# Patient Record
Sex: Female | Born: 1980 | Race: White | Hispanic: No | Smoking: Never smoker
Health system: Southern US, Community
[De-identification: ages and names within clinical notes are randomized; demographics above are authoritative.]

## PROBLEM LIST (undated history)

## (undated) ENCOUNTER — Inpatient Hospital Stay (HOSPITAL_COMMUNITY): Payer: Self-pay

## (undated) DIAGNOSIS — N390 Urinary tract infection, site not specified: Secondary | ICD-10-CM

## (undated) DIAGNOSIS — R51 Headache: Secondary | ICD-10-CM

## (undated) DIAGNOSIS — F429 Obsessive-compulsive disorder, unspecified: Secondary | ICD-10-CM

## (undated) DIAGNOSIS — F419 Anxiety disorder, unspecified: Secondary | ICD-10-CM

## (undated) DIAGNOSIS — B977 Papillomavirus as the cause of diseases classified elsewhere: Secondary | ICD-10-CM

## (undated) DIAGNOSIS — F329 Major depressive disorder, single episode, unspecified: Secondary | ICD-10-CM

## (undated) DIAGNOSIS — F32A Depression, unspecified: Secondary | ICD-10-CM

## (undated) DIAGNOSIS — M26629 Arthralgia of temporomandibular joint, unspecified side: Secondary | ICD-10-CM

## (undated) DIAGNOSIS — E7800 Pure hypercholesterolemia, unspecified: Secondary | ICD-10-CM

## (undated) HISTORY — PX: TONSILLECTOMY AND ADENOIDECTOMY: SUR1326

## (undated) HISTORY — DX: Pure hypercholesterolemia, unspecified: E78.00

## (undated) HISTORY — PX: ADENOIDECTOMY: SUR15

---
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
## 2002-01-04 ENCOUNTER — Emergency Department (HOSPITAL_COMMUNITY): Admission: EM | Admit: 2002-01-04 | Discharge: 2002-01-05 | Payer: Self-pay | Admitting: Emergency Medicine

## 2004-01-02 ENCOUNTER — Emergency Department (HOSPITAL_COMMUNITY): Admission: EM | Admit: 2004-01-02 | Discharge: 2004-01-02 | Payer: Self-pay | Admitting: Family Medicine

## 2004-01-11 ENCOUNTER — Emergency Department (HOSPITAL_COMMUNITY): Admission: EM | Admit: 2004-01-11 | Discharge: 2004-01-11 | Payer: Self-pay | Admitting: Family Medicine

## 2004-01-22 ENCOUNTER — Emergency Department (HOSPITAL_COMMUNITY): Admission: EM | Admit: 2004-01-22 | Discharge: 2004-01-22 | Payer: Self-pay | Admitting: Emergency Medicine

## 2004-02-03 ENCOUNTER — Emergency Department (HOSPITAL_COMMUNITY): Admission: EM | Admit: 2004-02-03 | Discharge: 2004-02-03 | Payer: Self-pay | Admitting: Emergency Medicine

## 2004-02-20 ENCOUNTER — Emergency Department (HOSPITAL_COMMUNITY): Admission: EM | Admit: 2004-02-20 | Discharge: 2004-02-20 | Payer: Self-pay | Admitting: *Deleted

## 2004-05-08 ENCOUNTER — Emergency Department (HOSPITAL_COMMUNITY): Admission: EM | Admit: 2004-05-08 | Discharge: 2004-05-08 | Payer: Self-pay | Admitting: Emergency Medicine

## 2004-05-10 ENCOUNTER — Emergency Department (HOSPITAL_COMMUNITY): Admission: EM | Admit: 2004-05-10 | Discharge: 2004-05-10 | Payer: Self-pay | Admitting: Family Medicine

## 2005-10-16 ENCOUNTER — Emergency Department (HOSPITAL_COMMUNITY): Admission: EM | Admit: 2005-10-16 | Discharge: 2005-10-16 | Payer: Self-pay | Admitting: Family Medicine

## 2006-03-13 ENCOUNTER — Emergency Department (HOSPITAL_COMMUNITY): Admission: EM | Admit: 2006-03-13 | Discharge: 2006-03-13 | Payer: Self-pay | Admitting: Family Medicine

## 2007-01-02 ENCOUNTER — Emergency Department (HOSPITAL_COMMUNITY): Admission: EM | Admit: 2007-01-02 | Discharge: 2007-01-02 | Payer: Self-pay | Admitting: Emergency Medicine

## 2007-04-21 ENCOUNTER — Emergency Department (HOSPITAL_COMMUNITY): Admission: EM | Admit: 2007-04-21 | Discharge: 2007-04-21 | Payer: Self-pay | Admitting: Emergency Medicine

## 2007-06-03 ENCOUNTER — Emergency Department (HOSPITAL_COMMUNITY): Admission: EM | Admit: 2007-06-03 | Discharge: 2007-06-03 | Payer: Self-pay | Admitting: Emergency Medicine

## 2007-11-12 ENCOUNTER — Emergency Department (HOSPITAL_COMMUNITY): Admission: EM | Admit: 2007-11-12 | Discharge: 2007-11-12 | Payer: Self-pay | Admitting: Emergency Medicine

## 2008-01-05 ENCOUNTER — Ambulatory Visit: Payer: Self-pay | Admitting: Family Medicine

## 2008-04-07 ENCOUNTER — Telehealth: Payer: Self-pay | Admitting: *Deleted

## 2008-04-08 ENCOUNTER — Telehealth (INDEPENDENT_AMBULATORY_CARE_PROVIDER_SITE_OTHER): Payer: Self-pay | Admitting: *Deleted

## 2008-04-08 ENCOUNTER — Ambulatory Visit: Payer: Self-pay | Admitting: Family Medicine

## 2008-05-22 ENCOUNTER — Inpatient Hospital Stay (HOSPITAL_COMMUNITY): Admission: AD | Admit: 2008-05-22 | Discharge: 2008-05-22 | Payer: Self-pay | Admitting: Obstetrics & Gynecology

## 2008-05-31 ENCOUNTER — Inpatient Hospital Stay (HOSPITAL_COMMUNITY): Admission: AD | Admit: 2008-05-31 | Discharge: 2008-05-31 | Payer: Self-pay | Admitting: Obstetrics and Gynecology

## 2008-06-17 ENCOUNTER — Telehealth: Payer: Self-pay | Admitting: *Deleted

## 2008-06-17 ENCOUNTER — Ambulatory Visit: Payer: Self-pay

## 2008-07-06 ENCOUNTER — Encounter: Admission: RE | Admit: 2008-07-06 | Discharge: 2008-07-06 | Payer: Self-pay | Admitting: Obstetrics & Gynecology

## 2008-07-26 ENCOUNTER — Ambulatory Visit: Payer: Self-pay | Admitting: Family Medicine

## 2008-08-05 ENCOUNTER — Inpatient Hospital Stay (HOSPITAL_COMMUNITY): Admission: AD | Admit: 2008-08-05 | Discharge: 2008-08-06 | Payer: Self-pay | Admitting: Obstetrics and Gynecology

## 2008-08-06 ENCOUNTER — Telehealth: Payer: Self-pay | Admitting: *Deleted

## 2008-09-02 ENCOUNTER — Inpatient Hospital Stay (HOSPITAL_COMMUNITY): Admission: AD | Admit: 2008-09-02 | Discharge: 2008-09-02 | Payer: Self-pay | Admitting: Obstetrics and Gynecology

## 2008-09-04 ENCOUNTER — Inpatient Hospital Stay (HOSPITAL_COMMUNITY): Admission: AD | Admit: 2008-09-04 | Discharge: 2008-09-04 | Payer: Self-pay | Admitting: Obstetrics and Gynecology

## 2008-09-06 ENCOUNTER — Inpatient Hospital Stay (HOSPITAL_COMMUNITY): Admission: AD | Admit: 2008-09-06 | Discharge: 2008-09-06 | Payer: Self-pay | Admitting: Obstetrics and Gynecology

## 2008-09-06 ENCOUNTER — Inpatient Hospital Stay (HOSPITAL_COMMUNITY): Admission: AD | Admit: 2008-09-06 | Discharge: 2008-09-09 | Payer: Self-pay | Admitting: Obstetrics and Gynecology

## 2008-10-11 ENCOUNTER — Telehealth (INDEPENDENT_AMBULATORY_CARE_PROVIDER_SITE_OTHER): Payer: Self-pay | Admitting: Family Medicine

## 2008-10-12 ENCOUNTER — Ambulatory Visit: Payer: Self-pay | Admitting: Family Medicine

## 2008-10-12 DIAGNOSIS — F329 Major depressive disorder, single episode, unspecified: Secondary | ICD-10-CM

## 2008-10-18 ENCOUNTER — Emergency Department (HOSPITAL_COMMUNITY): Admission: EM | Admit: 2008-10-18 | Discharge: 2008-10-18 | Payer: Self-pay | Admitting: Emergency Medicine

## 2008-10-18 ENCOUNTER — Telehealth (INDEPENDENT_AMBULATORY_CARE_PROVIDER_SITE_OTHER): Payer: Self-pay | Admitting: Family Medicine

## 2008-11-12 ENCOUNTER — Encounter: Admission: RE | Admit: 2008-11-12 | Discharge: 2008-11-12 | Payer: Self-pay | Admitting: Obstetrics and Gynecology

## 2008-11-24 ENCOUNTER — Encounter (INDEPENDENT_AMBULATORY_CARE_PROVIDER_SITE_OTHER): Payer: Self-pay | Admitting: Family Medicine

## 2009-02-16 ENCOUNTER — Emergency Department (HOSPITAL_COMMUNITY): Admission: EM | Admit: 2009-02-16 | Discharge: 2009-02-16 | Payer: Self-pay | Admitting: Family Medicine

## 2009-02-16 ENCOUNTER — Emergency Department (HOSPITAL_COMMUNITY): Admission: EM | Admit: 2009-02-16 | Discharge: 2009-02-16 | Payer: Self-pay | Admitting: Emergency Medicine

## 2009-05-23 ENCOUNTER — Emergency Department (HOSPITAL_COMMUNITY): Admission: EM | Admit: 2009-05-23 | Discharge: 2009-05-23 | Payer: Self-pay | Admitting: Emergency Medicine

## 2010-02-15 ENCOUNTER — Emergency Department (HOSPITAL_COMMUNITY): Admission: EM | Admit: 2010-02-15 | Discharge: 2010-02-15 | Payer: Self-pay | Admitting: Emergency Medicine

## 2010-05-20 ENCOUNTER — Emergency Department (HOSPITAL_COMMUNITY)
Admission: EM | Admit: 2010-05-20 | Discharge: 2010-05-20 | Payer: Self-pay | Source: Home / Self Care | Admitting: Emergency Medicine

## 2010-05-25 ENCOUNTER — Emergency Department (HOSPITAL_COMMUNITY): Admission: EM | Admit: 2010-05-25 | Discharge: 2009-07-05 | Payer: Self-pay | Admitting: Emergency Medicine

## 2010-07-30 ENCOUNTER — Emergency Department (HOSPITAL_COMMUNITY): Payer: Self-pay

## 2010-07-30 ENCOUNTER — Emergency Department (HOSPITAL_COMMUNITY)
Admission: EM | Admit: 2010-07-30 | Discharge: 2010-07-30 | Disposition: A | Discharge status: Home / Self Care | Payer: Self-pay | Attending: Emergency Medicine | Admitting: Emergency Medicine

## 2010-07-30 DIAGNOSIS — R51 Headache: Secondary | ICD-10-CM | POA: Insufficient documentation

## 2010-07-30 DIAGNOSIS — F341 Dysthymic disorder: Secondary | ICD-10-CM | POA: Insufficient documentation

## 2010-07-30 DIAGNOSIS — IMO0002 Reserved for concepts with insufficient information to code with codable children: Secondary | ICD-10-CM | POA: Insufficient documentation

## 2010-07-30 DIAGNOSIS — Y929 Unspecified place or not applicable: Secondary | ICD-10-CM | POA: Insufficient documentation

## 2010-07-30 DIAGNOSIS — S0990XA Unspecified injury of head, initial encounter: Secondary | ICD-10-CM | POA: Insufficient documentation

## 2010-07-30 DIAGNOSIS — F8081 Childhood onset fluency disorder: Secondary | ICD-10-CM | POA: Insufficient documentation

## 2010-08-28 LAB — BASIC METABOLIC PANEL
BUN: 13 mg/dL (ref 6–23)
Chloride: 103 mEq/L (ref 96–112)
Creatinine, Ser: 0.67 mg/dL (ref 0.4–1.2)
GFR calc non Af Amer: >60 mL/min (ref >60)
Glucose, Bld: 107 mg/dL — ABNORMAL HIGH (ref 70–99)
Potassium: 4.1 mEq/L (ref 3.5–5.1)

## 2010-08-28 LAB — CBC
HCT: 39 % (ref 36.0–46.0)
MCHC: 34.1 g/dL (ref 30.0–36.0)
MCV: 87.6 fL (ref 78.0–100.0)
Platelets: 313 10*3/uL (ref 150–400)
RDW: 13.2 % (ref 11.5–15.5)
WBC: 9 10*3/uL (ref 4.0–10.5)

## 2010-08-28 LAB — DIFFERENTIAL
Basophils Absolute: 0 10*3/uL (ref 0.0–0.1)
Basophils Relative: 0 % (ref 0–1)
Eosinophils Relative: 1 % (ref 0–5)
Lymphocytes Relative: 41 % (ref 12–46)
Monocytes Absolute: 0.5 10*3/uL (ref 0.1–1.0)

## 2010-08-31 LAB — POCT I-STAT, CHEM 8
Calcium, Ion: 1.27 mmol/L (ref 1.12–1.32)
HCT: 43 % (ref 36.0–46.0)
Hemoglobin: 14.6 g/dL (ref 12.0–15.0)
Sodium: 140 mEq/L (ref 135–145)
TCO2: 29 mmol/L (ref 0–100)

## 2010-09-19 LAB — POCT PREGNANCY, URINE: Preg Test, Ur: NEGATIVE

## 2010-09-22 LAB — URINALYSIS, ROUTINE W REFLEX MICROSCOPIC
Bilirubin Urine: NEGATIVE
Ketones, ur: NEGATIVE mg/dL
Leukocytes, UA: NEGATIVE
Nitrite: NEGATIVE
Protein, ur: NEGATIVE mg/dL
Urobilinogen, UA: 0.2 mg/dL (ref 0.0–1.0)

## 2010-09-22 LAB — GC/CHLAMYDIA PROBE AMP, GENITAL: Chlamydia, DNA Probe: NEGATIVE

## 2010-09-22 LAB — PROTIME-INR
INR: 0.9 (ref 0.00–1.49)
Prothrombin Time: 12.1 seconds (ref 11.6–15.2)

## 2010-09-22 LAB — WET PREP, GENITAL
Trich, Wet Prep: NONE SEEN
Yeast Wet Prep HPF POC: NONE SEEN

## 2010-09-22 LAB — DIFFERENTIAL
Basophils Relative: 0 % (ref 0–1)
Eosinophils Absolute: 0.1 10*3/uL (ref 0.0–0.7)
Lymphs Abs: 2 10*3/uL (ref 0.7–4.0)
Neutro Abs: 5.8 10*3/uL (ref 1.7–7.7)
Neutrophils Relative %: 70 % (ref 43–77)

## 2010-09-22 LAB — CBC
MCV: 87.8 fL (ref 78.0–100.0)
Platelets: 264 10*3/uL (ref 150–400)
RBC: 4.29 MIL/uL (ref 3.87–5.11)
WBC: 8.4 10*3/uL (ref 4.0–10.5)

## 2010-09-22 LAB — URINE MICROSCOPIC-ADD ON

## 2010-09-22 LAB — APTT: aPTT: 32 seconds (ref 24–37)

## 2010-09-28 LAB — CBC
HCT: 39.2 % (ref 36.0–46.0)
Hemoglobin: 12.2 g/dL (ref 12.0–15.0)
Platelets: 142 10*3/uL — ABNORMAL LOW (ref 150–400)
Platelets: 217 10*3/uL (ref 150–400)
RBC: 4.02 MIL/uL (ref 3.87–5.11)
RDW: 14.1 % (ref 11.5–15.5)
WBC: 12.6 10*3/uL — ABNORMAL HIGH (ref 4.0–10.5)

## 2010-09-28 LAB — URINE MICROSCOPIC-ADD ON

## 2010-09-28 LAB — GLUCOSE, CAPILLARY: Glucose-Capillary: 130 mg/dL — ABNORMAL HIGH (ref 70–99)

## 2010-09-28 LAB — COMPREHENSIVE METABOLIC PANEL
ALT: 18 U/L (ref 0–35)
Alkaline Phosphatase: 106 U/L (ref 39–117)
CO2: 24 mEq/L (ref 19–32)
Chloride: 103 mEq/L (ref 96–112)
GFR calc non Af Amer: >60 mL/min (ref >60)
Glucose, Bld: 104 mg/dL — ABNORMAL HIGH (ref 70–99)
Potassium: 3.8 mEq/L (ref 3.5–5.1)
Sodium: 135 mEq/L (ref 135–145)
Total Bilirubin: 0.3 mg/dL (ref 0.3–1.2)

## 2010-09-28 LAB — URINALYSIS, ROUTINE W REFLEX MICROSCOPIC
Bilirubin Urine: NEGATIVE
Glucose, UA: NEGATIVE mg/dL
Ketones, ur: NEGATIVE mg/dL
Protein, ur: NEGATIVE mg/dL
pH: 6 (ref 5.0–8.0)

## 2010-09-28 LAB — URIC ACID: Uric Acid, Serum: 5.4 mg/dL (ref 2.4–7.0)

## 2010-10-31 NOTE — Discharge Summary (Signed)
Debra Herrera, Debra Herrera            ACCOUNT NO.:  [id number]   MEDICAL RECORD NO.:  [id number]          PATIENT TYPE:  INP   LOCATION:  9131                          FACILITY:  [facility]   PHYSICIAN:  Gerrit Friends. Aldona Bar, M.D.   DATE OF BIRTH:  1981-02-07   DATE OF ADMISSION:  09/06/2008  DATE OF DISCHARGE:                               DISCHARGE SUMMARY   DISCHARGE DIAGNOSES:  1. Term pregnancy delivered 7 pounds 11 ounce female infant, Apgars 8      and 9.  2. Blood type O negative - baby Rh negative, RhoGAM not needed.  3. Induction of labor at term for gestational diabetes, medication      controlled.   PROCEDURES:  1. Vacuum extraction-assisted delivery.  2. Second-degree midline episiotomy.   SUMMARY:  This 30 year old primigravida with a due date of September 09, 2008, was admitted on September 06, 2008, for induction because of  gestational diabetes controlled by glyburide.  She was induced,  progressed, and ultimately required a vacuum extraction-assisted  delivery and was delivered of a 7 pounds 11 ounces female infant with  Apgars of 8 and 9 over a second-degree midline episiotomy.  Her  postpartum course was uncomplicated.  Discharge hemoglobin was 9.1 with  a white count of 12,600, and platelet count of 142,000.  On the morning  of September 09, 2008, she was ambulating well, tolerating a regular diet  well, was breast-feeding with minimal difficulty, was having normal  bowel and bladder function, vital signs were stable, and she was ready  for discharge.  Accordingly, she was given all appropriate instructions  and understood all instructions well.   Discharge medications include vitamins as long she is breast-feeding,  Feosol capsules - 1 daily, Motrin 600 mg every 6 hours as needed for  cramping or mild pain, and Tylox 1-2 every 4-6 hours as needed for more  severe pain.  She will return to the office for followup in  approximately 4 weeks' time or as needed.  She was given a  discharge  brochure at the time of discharge and understood all instructions fairly  well.   CONDITION ON DISCHARGE:  Improved.      Gerrit Friends. Aldona Bar, M.D.  Electronically Signed     RMW/MEDQ  D:  09/09/2008  T:  09/09/2008  Job:  [id number]

## 2011-01-10 ENCOUNTER — Inpatient Hospital Stay (INDEPENDENT_AMBULATORY_CARE_PROVIDER_SITE_OTHER)
Admission: RE | Admit: 2011-01-10 | Discharge: 2011-01-10 | Disposition: A | Discharge status: Home / Self Care | Payer: Self-pay | Source: Ambulatory Visit | Attending: Emergency Medicine | Admitting: Emergency Medicine

## 2011-01-10 DIAGNOSIS — M26609 Unspecified temporomandibular joint disorder, unspecified side: Secondary | ICD-10-CM

## 2011-01-19 ENCOUNTER — Encounter (HOSPITAL_COMMUNITY): Payer: Self-pay | Admitting: *Deleted

## 2011-01-19 ENCOUNTER — Inpatient Hospital Stay (HOSPITAL_COMMUNITY)
Admission: AD | Admit: 2011-01-19 | Discharge: 2011-01-19 | Disposition: A | Discharge status: Home / Self Care | Payer: Self-pay | Source: Ambulatory Visit | Attending: Obstetrics and Gynecology | Admitting: Obstetrics and Gynecology

## 2011-01-19 DIAGNOSIS — N912 Amenorrhea, unspecified: Secondary | ICD-10-CM | POA: Insufficient documentation

## 2011-01-19 DIAGNOSIS — N949 Unspecified condition associated with female genital organs and menstrual cycle: Secondary | ICD-10-CM | POA: Insufficient documentation

## 2011-01-19 HISTORY — DX: Urinary tract infection, site not specified: N39.0

## 2011-01-19 HISTORY — DX: Headache: R51

## 2011-01-19 HISTORY — DX: Papillomavirus as the cause of diseases classified elsewhere: B97.7

## 2011-01-19 LAB — URINALYSIS, ROUTINE W REFLEX MICROSCOPIC
Ketones, ur: NEGATIVE mg/dL
Nitrite: NEGATIVE
Specific Gravity, Urine: >1.03 — ABNORMAL HIGH (ref 1.005–1.030)
pH: 6 (ref 5.0–8.0)

## 2011-01-19 LAB — WET PREP, GENITAL: Trich, Wet Prep: NONE SEEN

## 2011-01-19 LAB — URINE MICROSCOPIC-ADD ON

## 2011-01-19 NOTE — ED Notes (Signed)
To lobby to wait on results. 

## 2011-01-19 NOTE — ED Provider Notes (Signed)
History     Chief Complaint  Patient presents with  . Vaginal Discharge   HPI pt is not pregnant and presents with LMP 6/15 and thinks she may be pregnant.  She is not using anything for birth control.  She also has a thick yellow discharge with an odor.  Pt has a history of gestational diabetes and was put on gylburide. Pt delivered in 2010.  She has been a pt of Dr. Ewell Poe but does not have insurance now and cannot afford to return to their office.  She thinks she has put on weight recently.  She has not had follow up of her blood sugar.  She denies constipation or diarrhea or fever or pain.  She denies pain with urination.  She has a hx of UTI and BV.    Past Medical History  Diagnosis Date  . Headache   . Gestational diabetes   . Urinary tract infection   . Human papilloma virus     Past Surgical History  Procedure Date  . Tonsillectomy and adenoidectomy     No family history on file.  History  Substance Use Topics  . Smoking status: Never Smoker   . Smokeless tobacco: Not on file  . Alcohol Use: No    Allergies:  Allergies  Allergen Reactions  . Benadryl (Diphenhydramine Hcl) Rash  . Sulfa Antibiotics Rash    Prescriptions prior to admission  Medication Sig Dispense Refill  . cyclobenzaprine (FLEXERIL) 5 MG tablet Take 5-10 mg by mouth at bedtime as needed. As needed for muscle spasm       . diclofenac (VOLTAREN) 75 MG EC tablet Take 75 mg by mouth 2 (two) times daily as needed. As needed for pain       . escitalopram (LEXAPRO) 20 MG tablet Take 20 mg by mouth daily.        Marland Kitchen neomycin-bacitracin-polymyxin (NEOSPORIN) ointment Apply 1 application topically every 12 (twelve) hours. Apply as needed to blister and tick bite         ROS Physical Exam   Blood pressure 123/85, pulse 88, temperature 98.2 F (36.8 C), temperature source Oral, resp. rate 16, height 5' 3.8" (1.621 m), weight 184 lb 12.8 oz (83.825 kg), last menstrual period 12/03/2010, SpO2  98.00%.  Physical Exam  Nursing note and vitals reviewed. Constitutional: She is oriented to person, place, and time. She appears well-developed and well-nourished. No distress.  HENT:  Head: Normocephalic.  Eyes: Pupils are equal, round, and reactive to light.  Neck: Normal range of motion. Neck supple.  Cardiovascular: Normal rate.   Respiratory: Effort normal.  GI: Soft. There is no tenderness.  Genitourinary:       Mod amount of thick yellow discharge in vault; cervix clean nontender; uterus NSSC nontender  Musculoskeletal: Normal range of motion.  Neurological: She is alert and oriented to person, place, and time.  Skin: Skin is warm and dry.  Psychiatric: She has a normal mood and affect.    MAU Course  Procedures UPT negative; pelvic exam performed with wet prep and GC/chlamydia Discussed possible reasons for amenorrhea   Assessment and Plan  Amenorrhea Normal wet prep Debra Herrera 01/19/2011, 5:19 PM

## 2011-01-19 NOTE — Progress Notes (Signed)
Pt states she had not had a period since 6-17 and wants a pregnancy test. Has had a yellow/green vaginal d/c with a slight odor, no pain.

## 2011-01-20 LAB — GC/CHLAMYDIA PROBE AMP, GENITAL
Chlamydia, DNA Probe: NEGATIVE
GC Probe Amp, Genital: NEGATIVE

## 2011-01-24 NOTE — ED Provider Notes (Signed)
Agree with above note.  Debra Herrera 01/24/2011 4:39 PM

## 2011-01-30 ENCOUNTER — Ambulatory Visit (INDEPENDENT_AMBULATORY_CARE_PROVIDER_SITE_OTHER): Payer: Self-pay | Admitting: Family Medicine

## 2011-01-30 ENCOUNTER — Encounter: Payer: Self-pay | Admitting: Family Medicine

## 2011-01-30 VITALS — BP 112/76 | HR 86 | Ht 64.0 in | Wt 184.0 lb

## 2011-01-30 DIAGNOSIS — N912 Amenorrhea, unspecified: Secondary | ICD-10-CM

## 2011-01-30 DIAGNOSIS — N911 Secondary amenorrhea: Secondary | ICD-10-CM

## 2011-01-30 NOTE — Progress Notes (Deleted)
  Subjective:    Patient ID: Debra Herrera, female    DOB: Apr 16, 1981, 30 y.o.   MRN: [id number]  HPI Debra Herrera is 30 y.o. G [facility] presenting with concern for missing her menstrual period in July.    Irregular Menstruation Patient complains of irregular menses. Patient's last menstrual period was 12/03/2010. Menarche age: ***. Periods are {reg/irreg:715}, lasting {1-10:13787} days. Dysmenorrhea:{dysmenorrhea:716}. Cyclic symptoms include: {symptoms:13153}. Current contraception: {contraceptive method:5051}. History of infertility: {yes***/no:17258}. History of abnormal Pap smear: {yes***/no:17258}.  HA, visual disturbances, weight gain/loss Review of Systems      Objective:   Physical Exam        Assessment & Plan:

## 2011-01-30 NOTE — Patient Instructions (Signed)
I would like for you to be able to return to our clinic in 6 weeks for an assessment. Please read the diet information below and try to incorporate to 30 minutes of exercise into your diet 5 days a week. Also, please start taking prenatal vitamins in case you do become pregnant. Also, please complete your forms for financial assistance. Thank you for coming to clinic today.  - Dr. Clinton Sawyer    Calorie Counting Diet A calorie counting diet requires you to eat the number of calories that are right for you during a day. Calories are the measurement of how much energy you get from the food you eat. Eating the right amount of calories is important for staying at a healthy weight. If you eat too many calories your body will store them as fat and you may gain weight. If you eat too few calories you may lose weight. Counting the number of calories that you eat during a day will help you to know if you're eating the right amount. A Registered Dietitian can determine how many calories you need in a day. The amount of calories you need varies from person to person. If your goal is to lose weight you will need to eat fewer calories. Losing weight can benefit you if you are overweight or have health problems such as heart disease, high blood pressure or diabetes. If your goal is to gain weight, you will need to eat more calories. Gaining weight may be necessary if you have a certain health problem that causes your body to need more energy. TIPS Whether you are increasing or decreasing the number of calories you eat during a day, it may be hard to get used to changing what you eat and drink. The following are tips to help you keep track of the number of calories you are eating.  Measuring foods at home with measuring cups will help you to know the actual amount of food and number of calories you are eating.   Restaurants serve food in all different portion sizes. It is common that restaurants will serve food in  amounts worth 2 or more serving sizes. While eating out, it may be helpful to estimate how many servings of a food you are given. For example, a serving of cooked rice is 1/2 cup and that is the size of half of a fist. Knowing serving sizes will help you have a better idea of how much food you are eating at restaurants.   Ask for smaller portion sizes or child-size portions at restaurants.   Plan to eat half of a meal at a restaurant and take the rest home or share the other half with a friend   Read food labels for calorie content and serving size   Most packaged food has a Nutrition Facts Panel on its side or back. Here you can find out how many servings are in a package, the size of a serving, and the number of calories each serving has.   The serving size and number of servings per container are listed right below the Nutrition Facts heading. Just below the serving information, the number of calories in each serving is listed.   For example, say that a package has three cookies inside. The Nutrition Facts panel says that one serving is one cookie. Below that, it says that there are three servings in the container. The calories section of the Nutrition Facts says there are 90 calories. That means that there are  90 calories in one cookie. If you eat one cookie you have eaten 90 calories. If you eat all three cookies, you have eaten three times that amount, or 270 calories.  The list below tells you how big or small some common portion sizes are.  1 ounce (oz).................4 stacked dice.   3 oz.............................Marland KitchenDeck of cards.   1 teaspoon (tsp)..........Marland KitchenTip of little finger.   1 tablespoon (Tbsp).Marland KitchenMarland KitchenMarland KitchenTip of thumb.   2 Tbsp.........................Marland KitchenGolf ball.    Cup.........................Marland KitchenHalf of a fist.   1 Cup..........................Marland KitchenA fist.  KEEP A FOOD LOG Write down every food item that you eat, how much of the food you eat, and the number of calories in each  food that you eat during the day. At the end of the day or throughout the day you can add up the total number of calories you have eaten.   It may help to set up a list like the one below. Find out the calorie information by reading food labels.  Breakfast   Bran Flakes (1 cup, 110 calories).   Fat free milk ( cup, 45 calories).   Snack   Apple (1 medium, 80 calories).   Lunch   Spinach (1 cup, 20 calories).   Tomato ( medium, 20 calories).   Chicken breast strips (3 oz, 165 calories).   Shredded cheddar cheese ( cup, 110 calories).   Light [place] dressing (2 Tbsp, 60 calories).   Whole wheat bread (1 slice, 80 calories).   Tub margarine (1 tsp, 35 calories).   Vegetable soup (1 cup, 160 calories).   Dinner   Pork chop (3 oz, 190 calories).   Brown rice (1 cup, 215 calories).   Steamed broccoli ( cup, 20 calories).   Strawberries (1  cup, 65 calories).   Whipped cream (1 Tbsp, 50 calories).  Daily Calorie Total: 1425 Information from www.eatright.org, Foodwise Nutritional Analysis Database. Document Released: 06/04/2005 Document Re-Released: 06/26/2009 [facility] Patient Information 2011 [place], [place].

## 2011-01-30 NOTE — Progress Notes (Signed)
Subjective:    Debra Herrera is a 30 y.o. female who presents for evaluation of infrequent menses. Last menstrual period was 9 weeks ago. Current menses pattern: regular every 32-34 days.  Age of menarche was 30 years old. Current contraceptive method: none. Associated/contributing factors include family history of diabetes, hirsutism and obesity. Patient denies acne, galactorrhea, hyperglycemia, target symptoms of hypothyroidism, visual changes and new social stressors.. Evaluation to date: negative urine pregnancy test on 01/19/11 at the [facility] ED and also had CT of brain in Feb 2012 for unrelated trauma and there were no masses or anatomic abnormalities noted.   Patient's last menstrual period was 12/03/2010. The following portions of the patient's history were reviewed and updated as appropriate: current medications, past family history, past medical history and problem list. Review of Systems A comprehensive review of systems was negative except for: Gastrointestinal: positive for abdominal pain    Objective:  BP 112/76  Pulse 86  Ht 5\' 4"  (1.626 m)  Wt 184 lb (83.462 kg)  BMI 31.58 kg/m2  LMP 12/03/2010   General appearance: alert, cooperative, appears older than stated age and moderately obese Head: Normocephalic, without obvious abnormality, atraumatic Eyes: negative Neck: no adenopathy, no carotid bruit, no JVD, supple, symmetrical, trachea midline and thyroid not enlarged, symmetric, no tenderness/mass/nodules Lungs: clear to auscultation bilaterally Heart: regular rate and rhythm, S1, S2 normal, no murmur, click, rub or gallop Abdomen: soft, non-tender; bowel sounds normal; no masses,  no organomegaly Pulses: 2+ and symmetric Skin: Skin color, texture, turgor normal. No rashes or lesions  Endocrine:course brown hair on patient upper lip     Assessment:    Suspect polycystic ovary syndrome.    Plan:    Discussed the evaluation and treatment of amenorrhea with  the patient. Agricultural engineer distributed. Exercise goals: 30 minutes per day 5 days a week of walking RTC in 6 weeks or prn Also advised patient to restart prenatal vitamins given that she is actively trying to get pregnant

## 2011-02-07 ENCOUNTER — Encounter: Payer: Self-pay | Admitting: Family Medicine

## 2011-02-07 DIAGNOSIS — N911 Secondary amenorrhea: Secondary | ICD-10-CM | POA: Insufficient documentation

## 2011-02-07 NOTE — Assessment & Plan Note (Signed)
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 Given that this problem is relatively new in onset, she was advised to try to the least invasive and least costly approach to solving the issue which is weight loss. She will return in 6 weeks for further follow-up. At that time we will consider further work-up for the cause of secondary amenorrhea.

## 2011-02-24 ENCOUNTER — Emergency Department (HOSPITAL_COMMUNITY)
Admission: EM | Admit: 2011-02-24 | Discharge: 2011-02-25 | Disposition: A | Discharge status: Home / Self Care | Payer: Self-pay | Attending: Emergency Medicine | Admitting: Emergency Medicine

## 2011-02-24 DIAGNOSIS — M79609 Pain in unspecified limb: Secondary | ICD-10-CM | POA: Insufficient documentation

## 2011-02-24 DIAGNOSIS — W2203XA Walked into furniture, initial encounter: Secondary | ICD-10-CM | POA: Insufficient documentation

## 2011-02-24 DIAGNOSIS — S90129A Contusion of unspecified lesser toe(s) without damage to nail, initial encounter: Secondary | ICD-10-CM | POA: Insufficient documentation

## 2011-02-24 DIAGNOSIS — Y92009 Unspecified place in unspecified non-institutional (private) residence as the place of occurrence of the external cause: Secondary | ICD-10-CM | POA: Insufficient documentation

## 2011-02-25 ENCOUNTER — Emergency Department (HOSPITAL_COMMUNITY): Payer: Self-pay

## 2011-03-16 ENCOUNTER — Ambulatory Visit: Payer: Self-pay | Admitting: Family Medicine

## 2011-03-23 LAB — URINALYSIS, ROUTINE W REFLEX MICROSCOPIC
Hgb urine dipstick: NEGATIVE
Ketones, ur: 15 mg/dL — AB
Protein, ur: NEGATIVE mg/dL
Urobilinogen, UA: 1 mg/dL (ref 0.0–1.0)

## 2011-03-23 LAB — URINE MICROSCOPIC-ADD ON

## 2011-04-04 ENCOUNTER — Inpatient Hospital Stay (INDEPENDENT_AMBULATORY_CARE_PROVIDER_SITE_OTHER)
Admission: RE | Admit: 2011-04-04 | Discharge: 2011-04-04 | Disposition: A | Discharge status: Home / Self Care | Payer: Self-pay | Source: Ambulatory Visit | Attending: Emergency Medicine | Admitting: Emergency Medicine

## 2011-04-04 DIAGNOSIS — G44209 Tension-type headache, unspecified, not intractable: Secondary | ICD-10-CM

## 2011-06-27 ENCOUNTER — Emergency Department (HOSPITAL_COMMUNITY): Admission: EM | Admit: 2011-06-27 | Discharge: 2011-07-20 | Disposition: E | Payer: Self-pay | Source: Home / Self Care

## 2011-06-27 ENCOUNTER — Encounter (HOSPITAL_COMMUNITY): Payer: Self-pay | Admitting: Cardiology

## 2011-06-27 ENCOUNTER — Emergency Department (INDEPENDENT_AMBULATORY_CARE_PROVIDER_SITE_OTHER)
Admission: EM | Admit: 2011-06-27 | Discharge: 2011-06-27 | Disposition: A | Discharge status: Home / Self Care | Payer: Self-pay | Source: Home / Self Care | Attending: Emergency Medicine | Admitting: Emergency Medicine

## 2011-06-27 DIAGNOSIS — B9689 Other specified bacterial agents as the cause of diseases classified elsewhere: Secondary | ICD-10-CM

## 2011-06-27 DIAGNOSIS — N912 Amenorrhea, unspecified: Secondary | ICD-10-CM

## 2011-06-27 DIAGNOSIS — A499 Bacterial infection, unspecified: Secondary | ICD-10-CM

## 2011-06-27 DIAGNOSIS — J020 Streptococcal pharyngitis: Secondary | ICD-10-CM

## 2011-06-27 DIAGNOSIS — N760 Acute vaginitis: Secondary | ICD-10-CM

## 2011-06-27 LAB — WET PREP, GENITAL
Clue Cells Wet Prep HPF POC: NONE SEEN
Trich, Wet Prep: NONE SEEN
Yeast Wet Prep HPF POC: NONE SEEN

## 2011-06-27 LAB — POCT URINALYSIS DIP (DEVICE)
Bilirubin Urine: NEGATIVE
Glucose, UA: NEGATIVE mg/dL
Ketones, ur: NEGATIVE mg/dL
Specific Gravity, Urine: 1.015 (ref 1.005–1.030)
Urobilinogen, UA: 0.2 mg/dL (ref 0.0–1.0)

## 2011-06-27 LAB — POCT PREGNANCY, URINE: Preg Test, Ur: NEGATIVE

## 2011-06-27 MED ORDER — METRONIDAZOLE 500 MG PO TABS: 500.0000 mg | ORAL_TABLET | Freq: Two times a day (BID) | ORAL | Status: AC

## 2011-06-27 MED ORDER — PENICILLIN V POTASSIUM 500 MG PO TABS: 500.0000 mg | ORAL_TABLET | Freq: Four times a day (QID) | ORAL | Status: AC

## 2011-06-27 MED ORDER — FLUCONAZOLE 150 MG PO TABS: 150.0000 mg | ORAL_TABLET | Freq: Once | ORAL | Status: AC

## 2011-06-27 MED ORDER — CYCLOBENZAPRINE HCL 5 MG PO TABS: 5.0000 mg | ORAL_TABLET | Freq: Every evening | ORAL | Status: DC | PRN

## 2011-06-27 NOTE — ED Notes (Signed)
Pt reports she has had nasal congestion, productive cough, sore throat, body aches, and temp 101.2 at home today. All symptoms started Monday. Tolerating PO liquids. Decreased appetite. Pt also reports vaginal discharge white with tinge of green and odor. Pt reports urine to have a strong odor.

## 2011-06-27 NOTE — ED Provider Notes (Addendum)
History     CSN: [id number]  Arrival date & time 07/11/11  1417   First MD Initiated Contact with Patient 07-11-2011 1615      Chief Complaint  Patient presents with  . Cough  . Fever  . Nasal Congestion  . Chills  . Generalized Body Aches  . Vaginal Discharge    (Consider location/radiation/quality/duration/timing/severity/associated sxs/prior treatment) HPI Comments: Debra Herrera is a 31 year old female who has 2 problems tonight, upper respiratory symptoms and vaginal discharge.  For the past 2 days she's felt achy all over, chilled, had fever of up to 101, sweats, malaise, fatigue, and headache. She also has sore throat, nasal congestion, rhinorrhea, sneezing, and a dry cough. She denies any abdominal pain, vomiting, or diarrhea. She's had no exposure to strep or to influenza. She has not had the flu vaccine this year. Her daughter had a similar illness recently.  For the past week she's had vaginal discharge which has been foul-smelling and somewhat itchy. Her urine also smells foul and she's had some incontinence. She notes itching and erythema. Her last menstrual period was 05/31/11. She's not using any birth control pills since she and her husband are trying to conceive.  Patient is a 31 y.o. female presenting with cough, fever, and vaginal discharge.  Cough Associated symptoms include chills, rhinorrhea and sore throat. Pertinent negatives include no ear pain, no shortness of breath, no wheezing and no eye redness.  Fever Primary symptoms of the febrile illness include fever, fatigue and cough. Primary symptoms do not include wheezing, shortness of breath, abdominal pain, nausea, vomiting, diarrhea, dysuria or rash.  Vaginal Discharge Pertinent negatives include no abdominal pain and no shortness of breath.    Past Medical History  Diagnosis Date  . Headache   . Gestational diabetes   . Urinary tract infection   . Human papilloma virus     Past Surgical History    Procedure Date  . Tonsillectomy and adenoidectomy     History reviewed. No pertinent family history.  History  Substance Use Topics  . Smoking status: Never Smoker   . Smokeless tobacco: Not on file  . Alcohol Use: No    OB History    Grav Para Term Preterm Abortions TAB SAB Ect Mult Living   1 1 1  0 0 0 0 0 0 1      Review of Systems  Constitutional: Positive for fever, chills, diaphoresis and fatigue.  HENT: Positive for congestion, sore throat and rhinorrhea. Negative for ear pain, sneezing, neck stiffness, voice change and postnasal drip.   Eyes: Negative for pain, discharge and redness.  Respiratory: Positive for cough. Negative for chest tightness, shortness of breath and wheezing.   Gastrointestinal: Negative for nausea, vomiting, abdominal pain and diarrhea.  Genitourinary: Positive for vaginal discharge. Negative for dysuria, urgency, frequency, hematuria, vaginal bleeding, difficulty urinating, genital sores, vaginal pain, menstrual problem, pelvic pain and dyspareunia.  Skin: Negative for rash.    Allergies  Benadryl and Sulfa antibiotics  Home Medications   Current Outpatient Rx  Name Route Sig Dispense Refill  . ESCITALOPRAM OXALATE 20 MG PO TABS Oral Take 20 mg by mouth daily.      . CYCLOBENZAPRINE HCL 5 MG PO TABS Oral Take 1-2 tablets (5-10 mg total) by mouth at bedtime as needed. As needed for muscle spasm 30 tablet 0  . FLUCONAZOLE 150 MG PO TABS Oral Take 1 tablet (150 mg total) by mouth once. 1 tablet 0  . METRONIDAZOLE 500 MG PO  TABS Oral Take 1 tablet (500 mg total) by mouth 2 (two) times daily. 14 tablet 0  . PENICILLIN V POTASSIUM 500 MG PO TABS Oral Take 1 tablet (500 mg total) by mouth 4 (four) times daily. 40 tablet 0    BP 116/73  Pulse 96  Temp(Src) 99.7 F (37.6 C) (Oral)  Resp 19  SpO2 100%  LMP 05/31/2011  Physical Exam  Nursing note and vitals reviewed. Constitutional: She appears well-developed and well-nourished. No distress.   HENT:  Head: Normocephalic and atraumatic.  Right Ear: External ear normal.  Left Ear: External ear normal.  Nose: Nose normal.  Mouth/Throat: Oropharyngeal exudate (her pharynx was erythematous with patches of yellowish white exudate) present.  Eyes: Conjunctivae and EOM are normal. Pupils are equal, round, and reactive to light. Right eye exhibits no discharge. Left eye exhibits no discharge.  Neck: Normal range of motion. Neck supple.  Cardiovascular: Normal rate, regular rhythm, normal heart sounds and intact distal pulses.  Exam reveals no gallop and no friction rub.   No murmur heard. Pulmonary/Chest: Effort normal and breath sounds normal. No stridor. No respiratory distress. She has no wheezes. She has no rales. She exhibits no tenderness.  Abdominal: Soft. Bowel sounds are normal. She exhibits no distension and no mass. There is no tenderness. There is no rebound and no guarding.  Genitourinary: Uterus normal. There is no rash or lesion on the right labia. There is no rash or lesion on the left labia. Uterus is not deviated, not enlarged, not fixed and not tender. Cervix exhibits no motion tenderness, no discharge and no friability. Right adnexum displays no mass and no tenderness. Left adnexum displays no mass and no tenderness. No erythema, tenderness or bleeding around the vagina. No foreign body around the vagina. Vaginal discharge found.       Pelvic exam reveals normal external genitalia. Vaginal and cervical mucosa were normal. There was a scant amount of whitish vaginal discharge. This was not malodorous. There was no cervical motion tenderness. Uterus was midposition and normal in size and shape, and without any tenderness. There was no adnexal tenderness or mass on the right. No mass on the left but there was moderate tenderness.  Lymphadenopathy:    She has no cervical adenopathy.  Skin: Skin is warm and dry. No rash noted. She is not diaphoretic.    ED Course  Procedures  (including critical care time)  Results for orders placed during the hospital encounter of 2011-07-20  POCT URINALYSIS DIP (DEVICE)      Component Value Range   Glucose, UA NEGATIVE  NEGATIVE (mg/dL)   Bilirubin Urine NEGATIVE  NEGATIVE    Ketones, ur NEGATIVE  NEGATIVE (mg/dL)   Specific Gravity, Urine 1.015  1.005 - 1.030    Hgb urine dipstick SMALL (*) NEGATIVE    pH 5.5  5.0 - 8.0    Protein, ur NEGATIVE  NEGATIVE (mg/dL)   Urobilinogen, UA 0.2  0.0 - 1.0 (mg/dL)   Nitrite NEGATIVE  NEGATIVE    Leukocytes, UA NEGATIVE  NEGATIVE   POCT PREGNANCY, URINE      Component Value Range   Preg Test, Ur NEGATIVE    WET PREP, GENITAL      Component Value Range   Yeast, Wet Prep NONE SEEN  NONE SEEN    Trich, Wet Prep NONE SEEN  NONE SEEN    Clue Cells, Wet Prep NONE SEEN  NONE SEEN    WBC, Wet Prep HPF POC MANY (*)  NONE SEEN   POCT RAPID STREP A ([facility] ONLY)      Component Value Range   Streptococcus, Group A Screen (Direct) POSITIVE (*) NEGATIVE      Labs Reviewed  POCT URINALYSIS DIP (DEVICE) - Abnormal; Notable for the following:    Hgb urine dipstick SMALL (*)    All other components within normal limits  WET PREP, GENITAL - Abnormal; Notable for the following:    WBC, Wet Prep HPF POC MANY (*)    All other components within normal limits  POCT RAPID STREP A ([facility] ONLY) - Abnormal; Notable for the following:    Streptococcus, Group A Screen (Direct) POSITIVE (*)    All other components within normal limits  POCT PREGNANCY, URINE  POCT PREGNANCY, URINE  POCT URINALYSIS DIPSTICK  GC/CHLAMYDIA PROBE AMP, GENITAL   No results found.   1. Strep pharyngitis   2. Bacterial vaginosis       MDM          Roque Lias, MD 07/23/11 1830  Refilled her cyclobenzaprine.  She will need to come in before any more.    Roque Lias, MD 08/14/11 [phone]

## 2011-06-28 LAB — GC/CHLAMYDIA PROBE AMP, GENITAL: GC Probe Amp, Genital: NEGATIVE

## 2011-07-20 DEATH — deceased

## 2011-08-14 MED ORDER — CYCLOBENZAPRINE HCL 5 MG PO TABS: 5.0000 mg | ORAL_TABLET | Freq: Every evening | ORAL | Status: DC | PRN

## 2011-08-15 ENCOUNTER — Telehealth (HOSPITAL_COMMUNITY): Payer: Self-pay | Admitting: *Deleted

## 2011-08-15 NOTE — ED Notes (Signed)
2/26 Pt. called and asked for a refill of Flexeril. Discussed with Dr. Lorenz Coaster and he e-prescribed it to the pharmacy -[facility] on [address]. 2/27 Pt. notified Rx. was refilled by the doctor this one time but after that she would have to be seen again. Pt. voiced understanding. Debra Herrera 08/15/2011

## 2011-11-16 ENCOUNTER — Emergency Department (INDEPENDENT_AMBULATORY_CARE_PROVIDER_SITE_OTHER)
Admission: EM | Admit: 2011-11-16 | Discharge: 2011-11-16 | Disposition: A | Discharge status: Home Health | Payer: Self-pay | Source: Home / Self Care | Attending: Emergency Medicine | Admitting: Emergency Medicine

## 2011-11-16 ENCOUNTER — Encounter (HOSPITAL_COMMUNITY): Payer: Self-pay

## 2011-11-16 DIAGNOSIS — H6590 Unspecified nonsuppurative otitis media, unspecified ear: Secondary | ICD-10-CM

## 2011-11-16 DIAGNOSIS — H6690 Otitis media, unspecified, unspecified ear: Secondary | ICD-10-CM

## 2011-11-16 HISTORY — DX: Arthralgia of temporomandibular joint, unspecified side: M26.629

## 2011-11-16 LAB — POCT URINALYSIS DIP (DEVICE)
Bilirubin Urine: NEGATIVE
Glucose, UA: NEGATIVE mg/dL
Ketones, ur: NEGATIVE mg/dL
Leukocytes, UA: NEGATIVE
Nitrite: NEGATIVE
Protein, ur: NEGATIVE mg/dL
Specific Gravity, Urine: 1.01 (ref 1.005–1.030)
Urobilinogen, UA: 0.2 mg/dL (ref 0.0–1.0)
pH: 6 (ref 5.0–8.0)

## 2011-11-16 LAB — POCT PREGNANCY, URINE: Preg Test, Ur: NEGATIVE

## 2011-11-16 MED ORDER — FLUTICASONE PROPIONATE 50 MCG/ACT NA SUSP: 2.0000 | Freq: Every day | NASAL | Status: DC

## 2011-11-16 MED ORDER — OXYMETAZOLINE HCL 0.05 % NA SOLN: 2.0000 | Freq: Two times a day (BID) | NASAL | Status: AC

## 2011-11-16 MED ORDER — FEXOFENADINE-PSEUDOEPHED ER 180-240 MG PO TB24: 1.0000 | ORAL_TABLET | Freq: Every day | ORAL | Status: DC

## 2011-11-16 MED ORDER — AMOXICILLIN 500 MG PO CAPS: 1000.0000 mg | ORAL_CAPSULE | Freq: Three times a day (TID) | ORAL | Status: AC

## 2011-11-16 MED ORDER — IBUPROFEN 600 MG PO TABS: 600.0000 mg | ORAL_TABLET | Freq: Four times a day (QID) | ORAL | Status: AC | PRN

## 2011-11-16 NOTE — ED Provider Notes (Signed)
History     CSN: [id number]  Arrival date & time 11/16/11  1478   First MD Initiated Contact with Patient 11/16/11 2014      Chief Complaint  Patient presents with  . Otalgia    (Consider location/radiation/quality/duration/timing/severity/associated sxs/prior treatment) HPI Comments: Patient reports "having a cold" for the past several days with nasal congestion, postnasal drip, sore, irritated throat. States that she has been using DayQuil, NyQuil without relief. She started some Sudafed earlier today per the pharmacist recommendations, and states that the congestion is starting to improve. She presents for acute onset of bilateral ear pain on the left more than right starting several hours ago. States she cannot hear out of the left ear, and states it feels like there is fluid behind the ears. States the pain is worse with tilting her head certain ways. No alleviating factors. States she is unable to "pop" in her ears. She has been forcefully blowing her nose trying clear the congestion during this time. No otorrhea. No nausea, vomiting, fevers. No popping or clicking of her jaw. No recent or remote history of trauma to her ears. No exposure to loud noises.   ROS as noted in HPI. All other ROS negative.   Patient is a 31 y.o. female presenting with ear pain. The history is provided by the patient. No language interpreter was used.  Otalgia This is a new problem. The current episode started 1 to 2 hours ago. There is pain in both ears. The problem occurs constantly. There has been no fever. Associated symptoms include headaches, hearing loss, rhinorrhea and sore throat. Pertinent negatives include no ear discharge, no neck pain and no rash. Her past medical history does not include chronic ear infection or tympanostomy tube.    Past Medical History  Diagnosis Date  . Headache   . Gestational diabetes   . Urinary tract infection   . Human papilloma virus   . TMJ arthralgia     Past  Surgical History  Procedure Date  . Tonsillectomy and adenoidectomy     History reviewed. No pertinent family history.  History  Substance Use Topics  . Smoking status: Never Smoker   . Smokeless tobacco: Not on file  . Alcohol Use: No    OB History    Grav Para Term Preterm Abortions TAB SAB Ect Mult Living   1 1 1  0 0 0 0 0 0 1      Review of Systems  HENT: Positive for hearing loss, ear pain, sore throat and rhinorrhea. Negative for neck pain and ear discharge.   Skin: Negative for rash.  Neurological: Positive for headaches.    Allergies  Benadryl and Sulfa antibiotics  Home Medications   Current Outpatient Rx  Name Route Sig Dispense Refill  . AMOXICILLIN 500 MG PO CAPS Oral Take 2 capsules (1,000 mg total) by mouth 3 (three) times daily. X 10 days 60 capsule 0  . CYCLOBENZAPRINE HCL 5 MG PO TABS Oral Take 1-2 tablets (5-10 mg total) by mouth at bedtime as needed. As needed for muscle spasm 30 tablet 0  . ESCITALOPRAM OXALATE 20 MG PO TABS Oral Take 20 mg by mouth daily.      Marland Kitchen FEXOFENADINE-PSEUDOEPHED ER 180-240 MG PO TB24 Oral Take 1 tablet by mouth daily. 14 tablet 0  . FLUTICASONE PROPIONATE 50 MCG/ACT NA SUSP Nasal Place 2 sprays into the nose daily. 16 g 2  . IBUPROFEN 600 MG PO TABS Oral Take 1 tablet (600 mg  total) by mouth every 6 (six) hours as needed for pain. 30 tablet 0  . OXYMETAZOLINE HCL 0.05 % NA SOLN Nasal Place 2 sprays into the nose 2 (two) times daily. Do not use for more than 3 days in a row 30 mL 0    BP 137/61  Pulse 107  Temp(Src) 99.3 F (37.4 C) (Oral)  Resp 20  SpO2 100%  LMP 10/25/2011  Physical Exam  Nursing note and vitals reviewed. Constitutional: She is oriented to person, place, and time. She appears well-developed and well-nourished. She appears distressed.       Appears uncomfortable, crying.  HENT:  Head: Normocephalic and atraumatic. No trismus in the jaw.  Right Ear: There is tenderness. Tympanic membrane is bulging.  A middle ear effusion is present.  Left Ear: There is tenderness. Tympanic membrane is injected and retracted. A middle ear effusion is present. Decreased hearing is noted.  Nose: Mucosal edema and rhinorrhea present. Right sinus exhibits no maxillary sinus tenderness and no frontal sinus tenderness. Left sinus exhibits no maxillary sinus tenderness and no frontal sinus tenderness.  Mouth/Throat: Uvula is midline and mucous membranes are normal. No uvula swelling. Posterior oropharyngeal edema present.       Clear amber Serous effusion right side with multiple air-fluid levels, purulent effusion left side. Bilateral nasal congestion, left side nearly occluded. Tonsils surgically absent.  Eyes: Conjunctivae and EOM are normal.  Neck: Normal range of motion.  Cardiovascular: Normal rate.   Pulmonary/Chest: Effort normal.  Abdominal: She exhibits no distension.  Musculoskeletal: Normal range of motion.  Lymphadenopathy:    She has no cervical adenopathy.  Neurological: She is alert and oriented to person, place, and time.  Skin: Skin is warm and dry.  Psychiatric: She has a normal mood and affect. Her behavior is normal. Judgment and thought content normal.    ED Course  Procedures (including critical care time)  Labs Reviewed  POCT URINALYSIS DIP (DEVICE) - Abnormal; Notable for the following:    Hgb urine dipstick TRACE (*)    All other components within normal limits  POCT PREGNANCY, URINE   No results found.   1. Otitis media   2. Serous otitis media, right      MDM  Patient unable to remember when she had her period will check UA and upreg. Otherwise has no urinary complaints.  suspect acute eustachian tube dysfunction with recent URI. She has a small amount of purulent effusion on the left side, will treat as otitis media. Patient appears to be in moderate discomfort, placed Auralgan in left ear with some improvement. Will start her on some antihistamines, decongestants,  Flonase, Afrin. Advised her to pinch her nose and gently blow until her ears clear. She'll followup with her primary care physician as needed.  Luiz Blare, MD 11/16/11 2129

## 2011-11-16 NOTE — Discharge Instructions (Signed)
Take the medication as written. Return if you get worse, have a fever >100.4, or for any concerns. You may take 600 mg of motrin with 1 gram of tylenol up to 4 times a day as needed for pain. This is an effective combination for pain. Use a neti pot or the NeilMed sinus rinse as often as you want to to reduce nasal congestion. Follow the directions on the box.  ° °Go to www.goodrx.com to look up your medications. This will give you a list of where you can find your prescriptions at the most affordable prices.  °

## 2011-11-16 NOTE — ED Notes (Signed)
Pt c/o bilateral ear pain onset 1 hour ago.  Pt states L ear is more painful than R.  Pt states she has TMJ and has had similar SX in past.  Pt crying.

## 2011-11-25 ENCOUNTER — Telehealth (HOSPITAL_COMMUNITY): Payer: Self-pay | Admitting: *Deleted

## 2011-11-25 NOTE — ED Notes (Signed)
Pt called via voicemail complaining of ear pain.  Pt was seen here 5/31 and given RX for Amoxicillin.  Pt states she is taking as directed.  Spoke with Dr. Chaney Malling and instructed pt to be re-evaluated by either [facility] or her PCP.

## 2012-03-15 ENCOUNTER — Encounter (HOSPITAL_COMMUNITY): Payer: Self-pay

## 2012-03-15 ENCOUNTER — Inpatient Hospital Stay (HOSPITAL_COMMUNITY)
Admission: AD | Admit: 2012-03-15 | Discharge: 2012-03-15 | Disposition: A | Discharge status: Home / Self Care | Payer: Medicaid Other | Source: Ambulatory Visit | Attending: Obstetrics & Gynecology | Admitting: Obstetrics & Gynecology

## 2012-03-15 DIAGNOSIS — Z3201 Encounter for pregnancy test, result positive: Secondary | ICD-10-CM | POA: Insufficient documentation

## 2012-03-15 LAB — POCT PREGNANCY, URINE: Preg Test, Ur: POSITIVE — AB

## 2012-03-15 NOTE — MAU Note (Signed)
Patient states she has had 2 positive home pregnancy tests. Patient needs confirmation. Patient denies any pain or bleeding with vaginal discharge on and off but none today. Has sore breasts and ankles.

## 2012-03-15 NOTE — MAU Provider Note (Signed)
History     CSN: [id number]  Arrival date and time: 03/15/12 1756   First Provider Initiated Contact with Patient 03/15/12 1843      Chief Complaint  Patient presents with  . Possible Pregnancy   HPI Debra Herrera 31 y.o. [redacted]w[redacted]d Comes to MAU to get verification of pregnancy.  Denies any complaints today.  Plans to see Dr. Dareen Piano when she has Medicaid.  OB History    Grav Para Term Preterm Abortions TAB SAB Ect Mult Living   2 1 1  0 0 0 0 0 0 1      Past Medical History  Diagnosis Date  . Headache   . Urinary tract infection   . Human papilloma virus   . TMJ arthralgia     Past Surgical History  Procedure Date  . Tonsillectomy and adenoidectomy     Family History  Problem Relation Age of Onset  . Other Neg Hx     History  Substance Use Topics  . Smoking status: Never Smoker   . Smokeless tobacco: Not on file  . Alcohol Use: No    Allergies:  Allergies  Allergen Reactions  . Benadryl (Diphenhydramine Hcl) Rash  . Sulfa Antibiotics Rash    Prescriptions prior to admission  Medication Sig Dispense Refill  . cyclobenzaprine (FLEXERIL) 5 MG tablet Take 1-2 tablets (5-10 mg total) by mouth at bedtime as needed. As needed for muscle spasm  30 tablet  0  . escitalopram (LEXAPRO) 20 MG tablet Take 20 mg by mouth daily.        . fexofenadine-pseudoephedrine (ALLEGRA-D 24 HOUR) 180-240 MG per 24 hr tablet Take 1 tablet by mouth daily.  14 tablet  0  . fluticasone (FLONASE) 50 MCG/ACT nasal spray Place 2 sprays into the nose daily.  16 g  2    Review of Systems  Constitutional: Negative for fever.  Gastrointestinal: Negative for nausea, vomiting and abdominal pain.  Genitourinary:       No vaginal discharge. No vaginal bleeding. No dysuria.   Physical Exam   Blood pressure 127/75, pulse 99, temperature 98.3 F (36.8 C), temperature source Oral, resp. rate 16, height 5\' 4"  (1.626 m), weight 86.728 kg (191 lb 3.2 oz), last menstrual period  01/19/2012.  Physical Exam  Nursing note and vitals reviewed. Constitutional: She is oriented to person, place, and time. She appears well-developed and well-nourished.  HENT:  Head: Normocephalic.  Eyes: EOM are normal.  Neck: Neck supple.  Musculoskeletal: Normal range of motion.  Neurological: She is alert and oriented to person, place, and time.  Skin: Skin is warm and dry.  Psychiatric: She has a normal mood and affect.    MAU Course  Procedures  MDM Results for orders placed during the hospital encounter of 03/15/12 (from the past 24 hour(s))  POCT PREGNANCY, URINE     Status: Abnormal   Collection Time   03/15/12  6:12 PM      Component Value Range   Preg Test, Ur POSITIVE (*) NEGATIVE    Assessment and Plan  Positive pregnancy test  Plan Verification of pregnancy given No smoking, no drugs, no alcohol.  Take a prenatal vitamin one by mouth every day.  Eat small frequent snacks to avoid nausea.  Begin prenatal care as soon as possible. Drink at least 8 8-oz glasses of water every day. Take Tylenol 325 mg 2 tablets by mouth every 4 hours if needed for pain. Stop taking Ibuprofen. OK to continue Lexapro.  Debra Herrera 03/15/2012, 6:50 PM

## 2012-04-09 ENCOUNTER — Other Ambulatory Visit: Payer: Self-pay | Admitting: Obstetrics and Gynecology

## 2012-05-26 ENCOUNTER — Encounter (HOSPITAL_COMMUNITY): Payer: Self-pay | Admitting: Emergency Medicine

## 2012-05-26 ENCOUNTER — Emergency Department (HOSPITAL_COMMUNITY): Payer: Medicaid Other

## 2012-05-26 ENCOUNTER — Emergency Department (HOSPITAL_COMMUNITY)
Admission: EM | Admit: 2012-05-26 | Discharge: 2012-05-26 | Disposition: A | Discharge status: Home / Self Care | Payer: Medicaid Other | Attending: Emergency Medicine | Admitting: Emergency Medicine

## 2012-05-26 DIAGNOSIS — S8990XA Unspecified injury of unspecified lower leg, initial encounter: Secondary | ICD-10-CM | POA: Insufficient documentation

## 2012-05-26 DIAGNOSIS — Z3490 Encounter for supervision of normal pregnancy, unspecified, unspecified trimester: Secondary | ICD-10-CM

## 2012-05-26 DIAGNOSIS — W108XXA Fall (on) (from) other stairs and steps, initial encounter: Secondary | ICD-10-CM | POA: Insufficient documentation

## 2012-05-26 DIAGNOSIS — Y9389 Activity, other specified: Secondary | ICD-10-CM | POA: Insufficient documentation

## 2012-05-26 DIAGNOSIS — Y929 Unspecified place or not applicable: Secondary | ICD-10-CM | POA: Insufficient documentation

## 2012-05-26 DIAGNOSIS — M25561 Pain in right knee: Secondary | ICD-10-CM

## 2012-05-26 DIAGNOSIS — Z8619 Personal history of other infectious and parasitic diseases: Secondary | ICD-10-CM | POA: Insufficient documentation

## 2012-05-26 DIAGNOSIS — S99929A Unspecified injury of unspecified foot, initial encounter: Secondary | ICD-10-CM | POA: Insufficient documentation

## 2012-05-26 DIAGNOSIS — Z8679 Personal history of other diseases of the circulatory system: Secondary | ICD-10-CM | POA: Insufficient documentation

## 2012-05-26 DIAGNOSIS — O9989 Other specified diseases and conditions complicating pregnancy, childbirth and the puerperium: Secondary | ICD-10-CM | POA: Insufficient documentation

## 2012-05-26 DIAGNOSIS — Z87440 Personal history of urinary (tract) infections: Secondary | ICD-10-CM | POA: Insufficient documentation

## 2012-05-26 DIAGNOSIS — Z79899 Other long term (current) drug therapy: Secondary | ICD-10-CM | POA: Insufficient documentation

## 2012-05-26 DIAGNOSIS — Z8739 Personal history of other diseases of the musculoskeletal system and connective tissue: Secondary | ICD-10-CM | POA: Insufficient documentation

## 2012-05-26 NOTE — Progress Notes (Signed)
Orthopedic Tech Progress Note Patient Details:  Debra Herrera September 13, 1980 [id number]  Ortho Devices Type of Ortho Device: Knee Sleeve Ortho Device/Splint Location: (R) le Ortho Device/Splint Interventions: Application   Jennye Moccasin 05/26/2012, 3:53 PM

## 2012-05-26 NOTE — ED Notes (Signed)
Slipped and fell on  Rt knee and she is 16 weeks ob ( see [facility])

## 2012-05-26 NOTE — ED Provider Notes (Signed)
History     CSN: [id number]  Arrival date & time 05/26/12  1301   First MD Initiated Contact with Patient 05/26/12 1350      No chief complaint on file.   (Consider location/radiation/quality/duration/timing/severity/associated sxs/prior treatment) HPIStephanie D Herrera is a 31 y.o. female G2 P1 at 16 weeks pregnancy presents after hurting her knee after a fall. She denies any abdominal pain, rush of fluid, vaginal bleeding since then. Her knee pain is moderate, sharp and shooting, worse when she is standing on it, she's taken some Tylenol which helped a little bit.   Past Medical History  Diagnosis Date  . Headache   . Urinary tract infection   . Human papilloma virus   . TMJ arthralgia     Past Surgical History  Procedure Date  . Tonsillectomy and adenoidectomy     Family History  Problem Relation Age of Onset  . Other Neg Hx     History  Substance Use Topics  . Smoking status: Never Smoker   . Smokeless tobacco: Not on file  . Alcohol Use: No    OB History    Grav Para Term Preterm Abortions TAB SAB Ect Mult Living   2 1 1  0 0 0 0 0 0 1      Review of Systems At least 10pt or greater review of systems completed and are negative except where specified in the HPI.  Allergies  Benadryl and Sulfa antibiotics  Home Medications   Current Outpatient Rx  Name  Route  Sig  Dispense  Refill  . ACETAMINOPHEN 500 MG PO TABS   Oral   Take 1,000 mg by mouth every 6 (six) hours as needed. For pain         . ESCITALOPRAM OXALATE 20 MG PO TABS   Oral   Take 20 mg by mouth daily.           Marland Kitchen PRENATAL MULTIVITAMIN CH   Oral   Take 1 tablet by mouth daily.           BP 113/68  Pulse 95  Temp 97.7 F (36.5 C)  Resp 16  SpO2 98%  LMP 01/19/2012  Physical Exam  Nursing notes reviewed.  Electronic medical record reviewed. VITAL SIGNS:   Filed Vitals:   05/26/12 1305  BP: 113/68  Pulse: 95  Temp: 97.7 F (36.5 C)  Resp: 16  SpO2: 98%    CONSTITUTIONAL: Awake, oriented, appears non-toxic HENT: Atraumatic, normocephalic, oral mucosa pink and moist, airway patent. Nares patent without drainage. External ears normal. EYES: Conjunctiva clear, EOMI, PERRLA NECK: Trachea midline, non-tender, supple CARDIOVASCULAR: Normal heart rate, Normal rhythm, No murmurs, rubs, gallops PULMONARY/CHEST: Clear to auscultation, no rhonchi, wheezes, or rales. Symmetrical breath sounds. Non-tender. ABDOMINAL: Non-distended, soft, mildly tender bilateral pelvis along round ligaments- no rebound or guarding.  BS normal. Abdomen is gravid to about 4 cm inferior to the umbilicus NEUROLOGIC: Non-focal, moving all four extremities, no gross sensory or motor deficits. EXTREMITIES: No clubbing, cyanosis, or edema SKIN: Warm, Dry, No erythema, No rash  ED Course  [place] bedside Performed by: Jones Skene Authorized by: Jones Skene Consent: Verbal consent obtained. Consent given by: patient Comments: Bedside ultrasound of patient's abdomen-transabdominal shows a single live intrauterine gestation, heart rate 150 good movement, good fluid.   (including critical care time)  Labs Reviewed - No data to display Dg Knee 4 Views W/patella Right  05/26/2012  *RADIOLOGY REPORT*  Clinical Data: Fall.  RIGHT KNEE - COMPLETE  4+ VIEW  Comparison: None.  Findings: No fracture or dislocation.  IMPRESSION: No fracture or dislocation.   Original Report Authenticated By: Lacy Duverney, M.D.      1. Right knee pain   2. Normal pregnancy       MDM  [name] is a 31 y.o. female injured her knee while going down some steps. Knee exam is unremarkable - x-ray is unremarkable also, patient mentioned she felt some crepitus was concerned her kneecap was broken. No fractures. Baby looks fine on ultrasound exam, mom denies any abdominal pain he rush of fluid or blood. No further workup needed at this time, baby's heart rate is strong and do not need to obtain  tocometer at this time. Mother is not having contractions.  Mother has followup with OB/GYN on Wednesday.  I explained the diagnosis and have given explicit precautions to return to the ER including rush of fluid, vaginal bleeding, abdominal pain or any other new or worsening symptoms. The patient understands and accepts the medical plan as it's been dictated and I have answered their questions. Discharge instructions concerning home care and prescriptions have been given.  The patient is STABLE and is discharged to home in good condition.         Jones Skene, MD 05/26/12 1714

## 2012-06-16 ENCOUNTER — Inpatient Hospital Stay (HOSPITAL_COMMUNITY)
Admission: AD | Admit: 2012-06-16 | Discharge: 2012-06-16 | Disposition: A | Discharge status: Hospice | Payer: Medicaid Other | Attending: Obstetrics and Gynecology | Admitting: Obstetrics and Gynecology

## 2012-06-16 ENCOUNTER — Encounter (HOSPITAL_COMMUNITY): Payer: Self-pay | Admitting: *Deleted

## 2012-06-16 DIAGNOSIS — R21 Rash and other nonspecific skin eruption: Secondary | ICD-10-CM

## 2012-06-16 NOTE — MAU Note (Signed)
Has a tiny red spot on her lower leg, that has been there for "a while" ; states that this spot has changed color and she is concerned that it may be a melanoma;

## 2012-06-16 NOTE — MAU Note (Signed)
Patient states she has a spot on the inside of the left lower leg that has been skin color that is now red. Denies any pain, but is concerned it might be melanoma. Denies any pregnancy problems, no bleeding, contractions or leaking.

## 2012-06-16 NOTE — MAU Provider Note (Signed)
  History     CSN: [id number]  Arrival date and time: 06/16/12 1514   First Provider Initiated Contact with Patient 06/16/12 1732      Chief Complaint  Patient presents with  . Rash   HPI [name] is a 31 y.o. G2 P1001 who presents to MAU today with complaint of a possible "skin cancer" on her left lower leg. The patient states that the spot has been there for years and is usually skin colored. Sometimes it changes to purple. It has never changed in size. Last night she noticed that it had changed to a red color which had never happened before. The patient used google to find images of skin cancer and found one that looked like hers. She does not have a primary care doctor and only has pregnancy medicaid so she didn't have anywhere else to go for evaluation.   OB History    Grav Para Term Preterm Abortions TAB SAB Ect Mult Living   2 1 1  0 0 0 0 0 0 1      Past Medical History  Diagnosis Date  . Headache   . Urinary tract infection   . Human papilloma virus   . TMJ arthralgia     Past Surgical History  Procedure Date  . Tonsillectomy and adenoidectomy   . Adenoidectomy     Family History  Problem Relation Age of Onset  . Other Neg Hx   . Hypertension Mother   . Diabetes Father   . Hypertension Father     History  Substance Use Topics  . Smoking status: Never Smoker   . Smokeless tobacco: Not on file  . Alcohol Use: No    Allergies:  Allergies  Allergen Reactions  . Benadryl (Diphenhydramine Hcl) Rash  . Sulfa Antibiotics Rash    Prescriptions prior to admission  Medication Sig Dispense Refill  . acetaminophen (TYLENOL) 500 MG tablet Take 1,000 mg by mouth every 6 (six) hours as needed. For pain in back      . escitalopram (LEXAPRO) 20 MG tablet Take 20 mg by mouth daily.        . Prenatal Vit-Fe Fumarate-FA (PRENATAL MULTIVITAMIN) TABS Take 1 tablet by mouth daily.        ROS All negative unless otherwise noted in HPI Physical Exam    Blood pressure 111/55, pulse 94, temperature 98.6 F (37 C), temperature source Oral, resp. rate 18, height 5' 3.5" (1.613 m), weight 180 lb 3.2 oz (81.738 kg), last menstrual period 01/19/2012, SpO2 100.00%.  Physical Exam  Constitutional: She is oriented to person, place, and time. She appears well-developed and well-nourished. No distress.  HENT:  Head: Normocephalic.  Cardiovascular: Normal rate.   Respiratory: Effort normal.  Neurological: She is alert and oriented to person, place, and time.  Skin: Skin is warm and dry. No erythema.     Psychiatric: Her mood appears anxious.    MAU Course  Procedures None  Assessment and Plan  A: Normal skin variation, irritated  P: Discharge home Patient reassured that this is not urgent and not a melanoma Patient given list of MDs to contact for further evaluation   [name], PA-C 06/16/2012, 5:47 PM

## 2012-06-18 NOTE — L&D Delivery Note (Signed)
Patient was C/C/+2 and pushed for 40 minutes with epidural.   NSVD  female infant, Apgars 7,9, weight P.   The patient had one midline perineal lacerations repaired with 2-0 vicryl R. Fundus was firm. EBL was expected. Placenta was delivered intact. Vagina was clear.  Baby was vigorous to bedside.  Debra Herrera

## 2012-06-25 NOTE — MAU Provider Note (Signed)
Attestation of Attending Supervision of Advanced Practitioner (CNM/NP): Evaluation and management procedures were performed by the Advanced Practitioner under my supervision and collaboration.  I have reviewed the Advanced Practitioner's note and chart, and I agree with the management and plan.  Yu Peggs 06/25/2012 9:17 AM

## 2012-08-05 IMAGING — CR DG CERVICAL SPINE COMPLETE 4+V
5 series · 5 of 5 positions shown · non-contrast
Comparison: 10/18/2008.

CLINICAL DATA: Neck pain.

CERVICAL SPINE - COMPLETE 4+ VIEW

[w c-spine lat *]
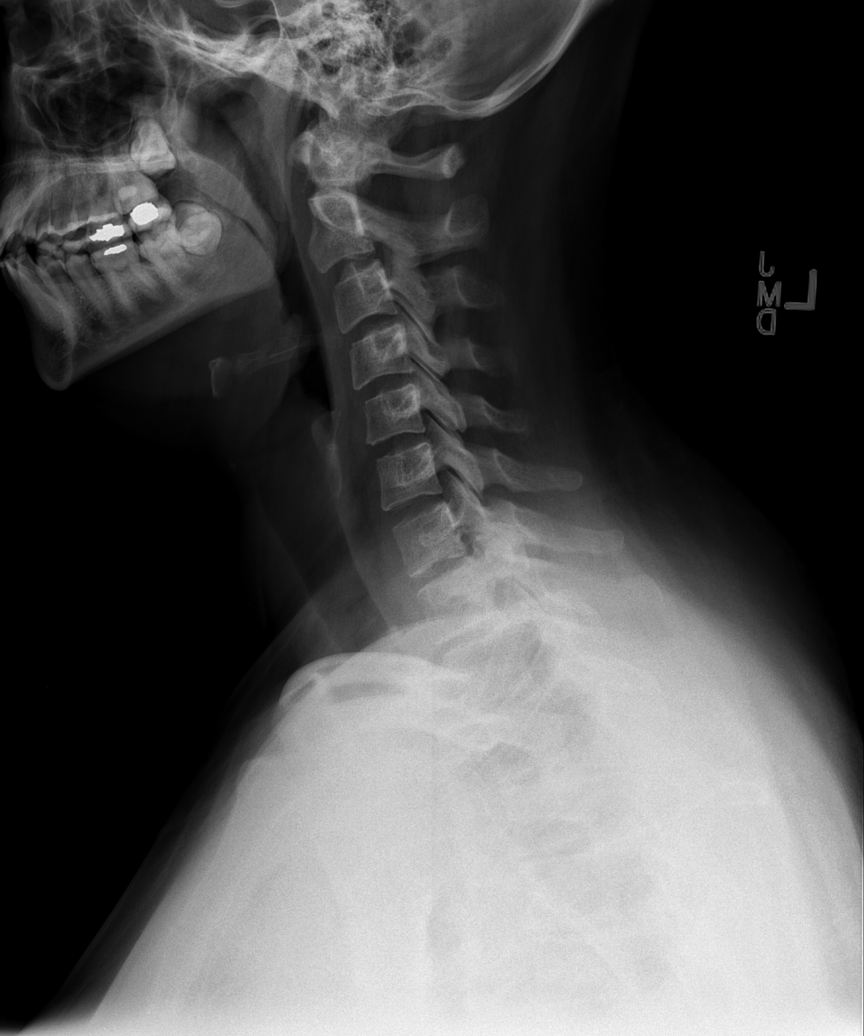

[w c-spine oblique * (1 of 2)]
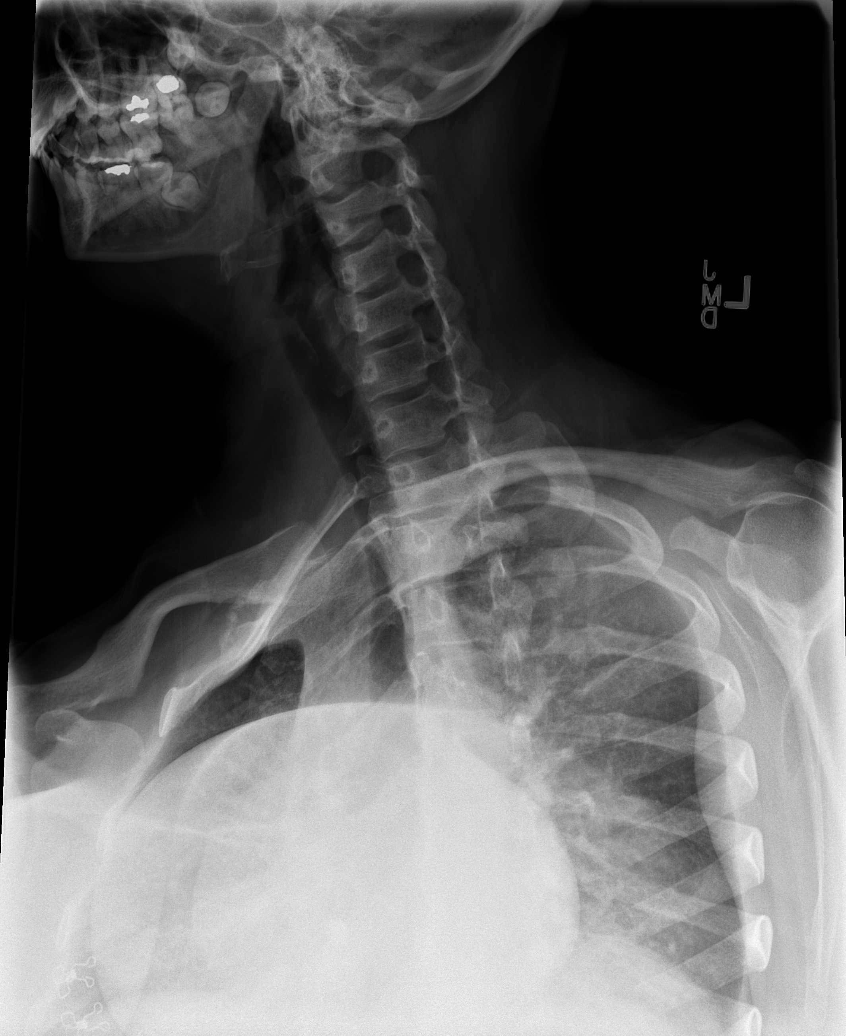

[w c-spine oblique * (2 of 2)]
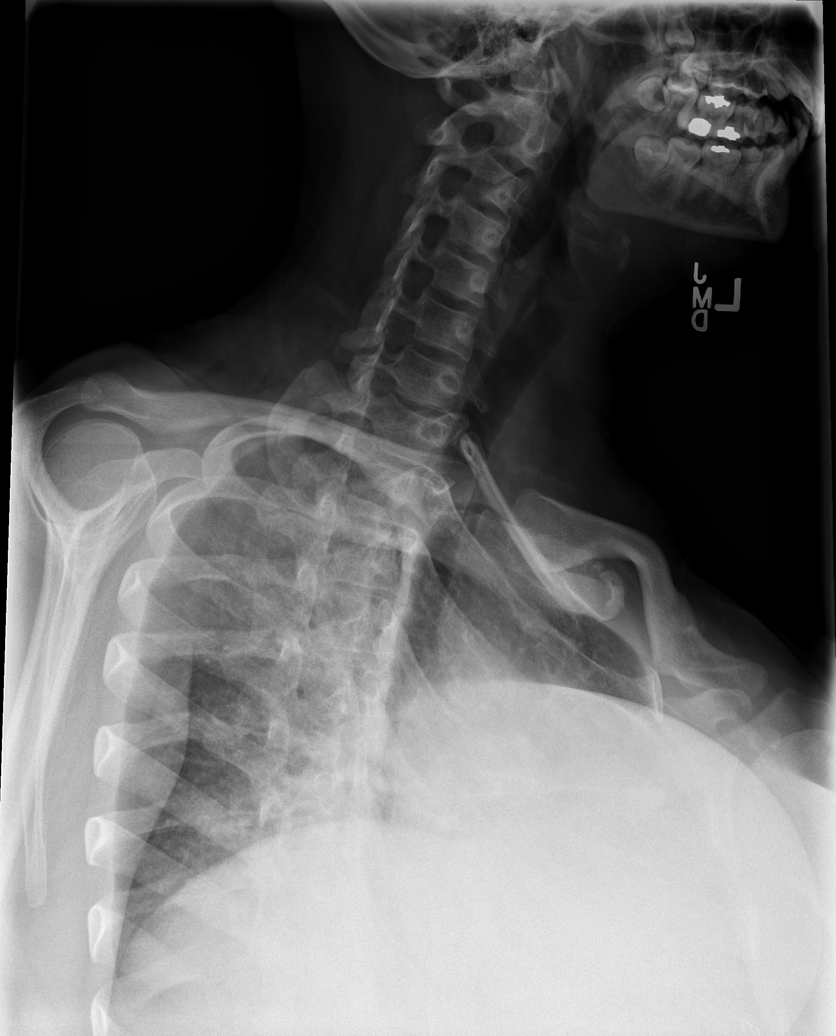

[w c-spine a.p. *]
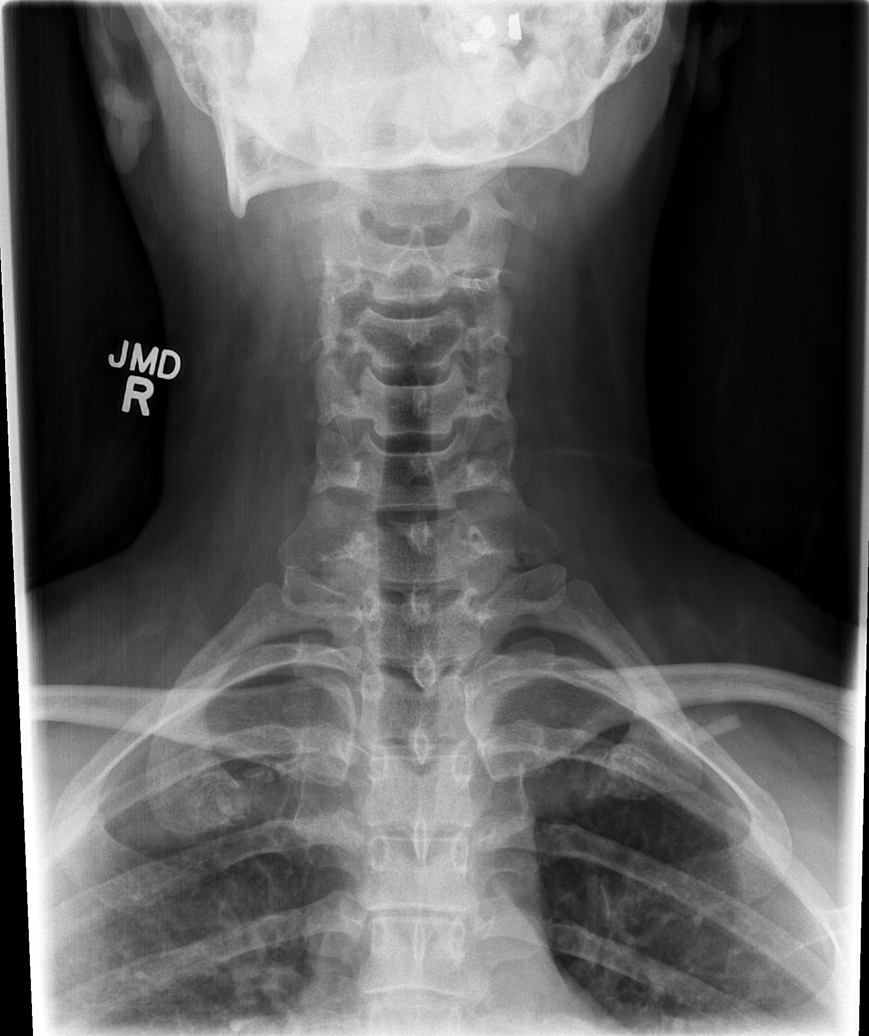

[w c-spine odontoid *]
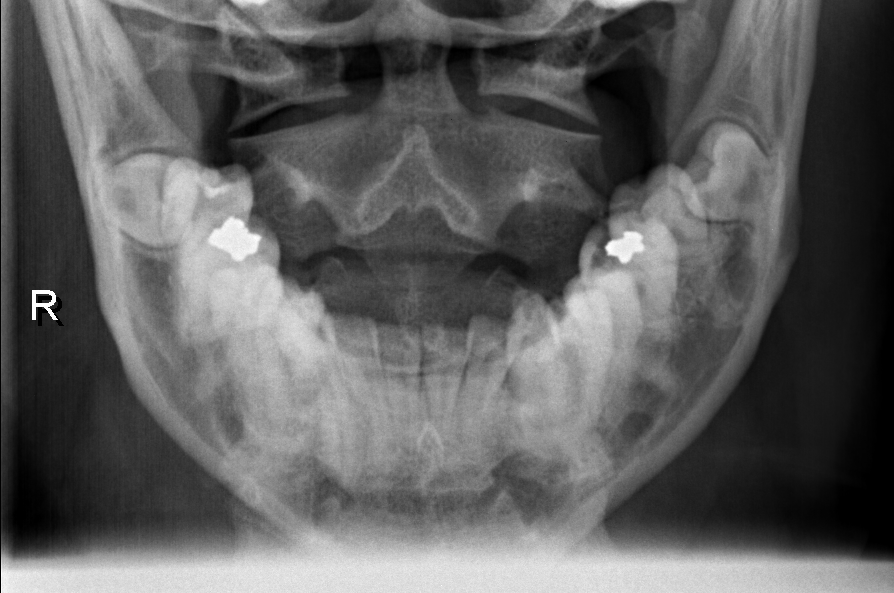

[5 of 5 positions shown; findings below may reference images not displayed]

FINDINGS: The prevertebral soft tissues are within normal limits.
The vertebral body heights and alignment are normal.  The lung
apices are clear.  No acute fracture or traumatic subluxation is
evident.
IMPRESSION: Negative cervical spine.

## 2012-09-03 ENCOUNTER — Inpatient Hospital Stay (HOSPITAL_COMMUNITY)
Admission: AD | Admit: 2012-09-03 | Discharge: 2012-09-03 | Disposition: A | Discharge status: Home / Self Care | Payer: Medicaid Other | Source: Ambulatory Visit | Attending: Obstetrics and Gynecology | Admitting: Obstetrics and Gynecology

## 2012-09-03 ENCOUNTER — Encounter (HOSPITAL_COMMUNITY): Payer: Self-pay

## 2012-09-03 DIAGNOSIS — M25569 Pain in unspecified knee: Secondary | ICD-10-CM | POA: Insufficient documentation

## 2012-09-03 DIAGNOSIS — O358XX0 Maternal care for other (suspected) fetal abnormality and damage, not applicable or unspecified: Secondary | ICD-10-CM | POA: Insufficient documentation

## 2012-09-03 DIAGNOSIS — O99891 Other specified diseases and conditions complicating pregnancy: Secondary | ICD-10-CM | POA: Insufficient documentation

## 2012-09-03 DIAGNOSIS — Y92009 Unspecified place in unspecified non-institutional (private) residence as the place of occurrence of the external cause: Secondary | ICD-10-CM | POA: Insufficient documentation

## 2012-09-03 DIAGNOSIS — R1084 Generalized abdominal pain: Secondary | ICD-10-CM

## 2012-09-03 DIAGNOSIS — Y9301 Activity, walking, marching and hiking: Secondary | ICD-10-CM | POA: Insufficient documentation

## 2012-09-03 DIAGNOSIS — R109 Unspecified abdominal pain: Secondary | ICD-10-CM | POA: Insufficient documentation

## 2012-09-03 DIAGNOSIS — W010XXA Fall on same level from slipping, tripping and stumbling without subsequent striking against object, initial encounter: Secondary | ICD-10-CM | POA: Insufficient documentation

## 2012-09-03 LAB — URINALYSIS, ROUTINE W REFLEX MICROSCOPIC
Bilirubin Urine: NEGATIVE
Ketones, ur: NEGATIVE mg/dL
Nitrite: NEGATIVE
Protein, ur: NEGATIVE mg/dL
Urobilinogen, UA: 0.2 mg/dL (ref 0.0–1.0)
pH: 6.5 (ref 5.0–8.0)

## 2012-09-03 NOTE — MAU Note (Signed)
Patient states she tripped and fell on her right knee at 1600. Started having upper left abdominal pain. Denies bleeding or leaking and reports only feeling one fetal movement today.

## 2012-09-03 NOTE — MAU Provider Note (Signed)
History     CSN: [id number]  Arrival date and time: 09/03/12 1191   First Provider Initiated Contact with Patient 09/03/12 1857      Chief Complaint  Patient presents with  . Fall  . Abdominal Pain  . Decreased Fetal Movement   HPI Debra Herrera is 32 y.o. G2P1001 [redacted]w[redacted]d weeks presenting with report of being awaken by her child, didn't have her glasses on and trip over ? Laundry basket or shoes--not clear which around 4pm.  Right knee was painful.  She sprayed dermaplast and then used neosporin on the abrasion--skin not broken.  Then began with upper and side on the right.  "Pain underneath the right breast and rib area and down to my stomach".  She called the office and was instructed to come here for evaluation.  Denies vaginal bleeding, leaking of fluid.  + fetal movement since fall.  States he has been less active all day.  Has been moving since in MAU       Past Medical History  Diagnosis Date  . Headache   . Urinary tract infection   . Human papilloma virus   . TMJ arthralgia     Past Surgical History  Procedure Laterality Date  . Tonsillectomy and adenoidectomy    . Adenoidectomy      Family History  Problem Relation Age of Onset  . Other Neg Hx   . Hypertension Mother   . Diabetes Father   . Hypertension Father     History  Substance Use Topics  . Smoking status: Never Smoker   . Smokeless tobacco: Not on file  . Alcohol Use: No    Allergies:  Allergies  Allergen Reactions  . Benadryl (Diphenhydramine Hcl) Rash  . Sulfa Antibiotics Rash    Prescriptions prior to admission  Medication Sig Dispense Refill  . acetaminophen (TYLENOL) 500 MG tablet Take 1,000 mg by mouth every 6 (six) hours as needed. For pain in back      . escitalopram (LEXAPRO) 20 MG tablet Take 20 mg by mouth daily.        . Prenatal Vit-Fe Fumarate-FA (PRENATAL MULTIVITAMIN) TABS Take 1 tablet by mouth daily.        Review of Systems  Constitutional: Negative.    Gastrointestinal: Positive for abdominal pain (upper left side pain).  Genitourinary:       Denies vaginal bleeding or loss of fluid.  + fetal movement since fall   Physical Exam   Blood pressure 134/69, pulse 88, temperature 98.7 F (37.1 C), temperature source Oral, resp. rate 16, height 5\' 4"  (1.626 m), weight 187 lb 9.6 oz (85.095 kg), last menstrual period 01/19/2012, SpO2 100.00%.  Physical Exam  Constitutional: She is oriented to person, place, and time. She appears well-developed and well-nourished. No distress.  HENT:  Head: Normocephalic.  Neck: Normal range of motion.  Cardiovascular: Normal rate.   Respiratory: Effort normal.  GI: Soft. There is tenderness (on the lateral right side). There is no rebound and no guarding.  Genitourinary:  Not indicated  Musculoskeletal: Normal range of motion.  Of right knee.  Reddened area without break of skin  Neurological: She is alert and oriented to person, place, and time.  Skin: Skin is warm and dry.  Psychiatric: She has a normal mood and affect. Her behavior is normal.   Results for orders placed during the hospital encounter of 09/03/12 (from the past 24 hour(s))  URINALYSIS, ROUTINE W REFLEX MICROSCOPIC     Status:  Abnormal   Collection Time    09/03/12  6:37 PM      Result Value Range   Color, Urine YELLOW  YELLOW   APPearance CLOUDY (*) CLEAR   Specific Gravity, Urine 1.025  1.005 - 1.030   pH 6.5  5.0 - 8.0   Glucose, UA NEGATIVE  NEGATIVE mg/dL   Hgb urine dipstick NEGATIVE  NEGATIVE   Bilirubin Urine NEGATIVE  NEGATIVE   Ketones, ur NEGATIVE  NEGATIVE mg/dL   Protein, ur NEGATIVE  NEGATIVE mg/dL   Urobilinogen, UA 0.2  0.0 - 1.0 mg/dL   Nitrite NEGATIVE  NEGATIVE   Leukocytes, UA NEGATIVE  NEGATIVE   MAU Course  Procedures   FMS baseline 145, moderate variability, reactive for 29 weeks.  Occ contraction  MDM 19:59  Reported MSE to Dr. Henderson Cloud and FMS findings since admission and UA results.  Per Dr. Henderson Cloud  it has been 4 hours since the fall and may now be discharge   Patient mentioned to RN that she was concerned about UTI--reassured that UA was negative             Assessment and Plan  A:  Fall at [redacted]w[redacted]d gestation  P:  Instructed to rest tonight.  Pelvic rest tonight        Keep scheduled appointment with Dr. Henderson Cloud.  Call for worsening sxs      Important to stay well hydrated.                Mykah Shin,EVE M 09/03/2012, 6:59 PM

## 2012-09-05 NOTE — MAU Provider Note (Signed)
H&P per NP

## 2012-09-16 ENCOUNTER — Inpatient Hospital Stay (HOSPITAL_COMMUNITY)
Admission: AD | Admit: 2012-09-16 | Discharge: 2012-09-16 | Disposition: A | Discharge status: Home / Self Care | Payer: Medicaid Other | Source: Ambulatory Visit | Attending: Obstetrics and Gynecology | Admitting: Obstetrics and Gynecology

## 2012-09-16 ENCOUNTER — Encounter (HOSPITAL_COMMUNITY): Payer: Self-pay | Admitting: *Deleted

## 2012-09-16 DIAGNOSIS — M545 Low back pain, unspecified: Secondary | ICD-10-CM | POA: Insufficient documentation

## 2012-09-16 DIAGNOSIS — M549 Dorsalgia, unspecified: Secondary | ICD-10-CM

## 2012-09-16 DIAGNOSIS — R109 Unspecified abdominal pain: Secondary | ICD-10-CM | POA: Insufficient documentation

## 2012-09-16 DIAGNOSIS — O4700 False labor before 37 completed weeks of gestation, unspecified trimester: Secondary | ICD-10-CM | POA: Insufficient documentation

## 2012-09-16 DIAGNOSIS — O26899 Other specified pregnancy related conditions, unspecified trimester: Secondary | ICD-10-CM

## 2012-09-16 DIAGNOSIS — O9989 Other specified diseases and conditions complicating pregnancy, childbirth and the puerperium: Secondary | ICD-10-CM

## 2012-09-16 DIAGNOSIS — O99891 Other specified diseases and conditions complicating pregnancy: Secondary | ICD-10-CM | POA: Insufficient documentation

## 2012-09-16 LAB — URINALYSIS, ROUTINE W REFLEX MICROSCOPIC
Bilirubin Urine: NEGATIVE
Hgb urine dipstick: NEGATIVE
Ketones, ur: 15 mg/dL — AB
Specific Gravity, Urine: 1.025 (ref 1.005–1.030)
Urobilinogen, UA: 0.2 mg/dL (ref 0.0–1.0)

## 2012-09-16 MED ORDER — CYCLOBENZAPRINE HCL 10 MG PO TABS
10.0000 mg | ORAL_TABLET | Freq: Once | ORAL | Status: AC
  Administered 2012-09-16: 10 mg via ORAL
  Filled 2012-09-16: qty 1

## 2012-09-16 MED ORDER — CYCLOBENZAPRINE HCL 10 MG PO TABS: 10.0000 mg | ORAL_TABLET | Freq: Two times a day (BID) | ORAL | Status: DC | PRN
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
## 2012-09-16 NOTE — Progress Notes (Signed)
Written and verbal d/c instructions given and understanding voiced. 

## 2012-10-07 ENCOUNTER — Encounter (HOSPITAL_COMMUNITY): Payer: Self-pay | Admitting: *Deleted

## 2012-10-07 ENCOUNTER — Inpatient Hospital Stay (HOSPITAL_COMMUNITY)
Admission: AD | Admit: 2012-10-07 | Discharge: 2012-10-08 | Disposition: A | Discharge status: Home / Self Care | Payer: Medicaid Other | Source: Ambulatory Visit | Attending: Obstetrics & Gynecology | Admitting: Obstetrics & Gynecology

## 2012-10-07 DIAGNOSIS — O99891 Other specified diseases and conditions complicating pregnancy: Secondary | ICD-10-CM | POA: Insufficient documentation

## 2012-10-07 DIAGNOSIS — N949 Unspecified condition associated with female genital organs and menstrual cycle: Secondary | ICD-10-CM | POA: Insufficient documentation

## 2012-10-07 DIAGNOSIS — O26893 Other specified pregnancy related conditions, third trimester: Secondary | ICD-10-CM

## 2012-10-07 HISTORY — DX: Obsessive-compulsive disorder, unspecified: F42.9

## 2012-10-07 HISTORY — DX: Anxiety disorder, unspecified: F41.9

## 2012-10-07 HISTORY — DX: Major depressive disorder, single episode, unspecified: F32.9

## 2012-10-07 HISTORY — DX: Depression, unspecified: F32.A
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
## 2012-10-08 ENCOUNTER — Encounter (HOSPITAL_COMMUNITY): Payer: Self-pay

## 2012-10-08 DIAGNOSIS — O26899 Other specified pregnancy related conditions, unspecified trimester: Secondary | ICD-10-CM

## 2012-10-08 LAB — URINALYSIS, ROUTINE W REFLEX MICROSCOPIC
Bilirubin Urine: NEGATIVE
Glucose, UA: NEGATIVE mg/dL
Hgb urine dipstick: NEGATIVE
Protein, ur: NEGATIVE mg/dL

## 2012-10-08 LAB — URINE MICROSCOPIC-ADD ON

## 2012-10-08 NOTE — MAU Provider Note (Signed)
History     CSN: [id number]  Arrival date and time: 10/07/12 2339   First Provider Initiated Contact with Patient 10/08/12 0024      No chief complaint on file.  HPI This is a 32 y.o. female at [redacted]w[redacted]d who presents with c/o leaking fluid, soaking pantiliner. Did not soak anything else. No bleeding. No contractions.   RN Note: PT SAYS AT 2300- SHE WAS PLAYING CARDS AND SUDDENLY SHE FELT LIKE SOMETHING WAS JABBING HER IN HER PELVIS- SO WENT TO B-ROOM- AND SAW FLUID- SHE CALLED DR- Arlyce Dice. ON WAY TO HOSPITAL- SHE FELT PELVIC PRESSURE. LAST SEX- 01-2012. DENIES HSV AND MRSA  OB History   Grav Para Term Preterm Abortions TAB SAB Ect Mult Living   2 1 1  0 0 0 0 0 0 1      Past Medical History  Diagnosis Date  . Headache   . Urinary tract infection   . Human papilloma virus   . TMJ arthralgia   . Anxiety   . Depression   . OCD (obsessive compulsive disorder)     Past Surgical History  Procedure Laterality Date  . Tonsillectomy and adenoidectomy    . Adenoidectomy      Family History  Problem Relation Age of Onset  . Other Neg Hx   . Hypertension Mother   . Diabetes Father   . Hypertension Father     History  Substance Use Topics  . Smoking status: Never Smoker   . Smokeless tobacco: Not on file  . Alcohol Use: No    Allergies:  Allergies  Allergen Reactions  . Benadryl (Diphenhydramine Hcl) Rash  . Sulfa Antibiotics Rash    Prescriptions prior to admission  Medication Sig Dispense Refill  . acetaminophen (TYLENOL) 500 MG tablet Take 1,000 mg by mouth every 6 (six) hours as needed. For pain in back      . cyclobenzaprine (FLEXERIL) 10 MG tablet Take 1 tablet (10 mg total) by mouth 2 (two) times daily as needed for muscle spasms.  20 tablet  0  . escitalopram (LEXAPRO) 20 MG tablet Take 20 mg by mouth daily.        . Prenatal Vit-Fe Fumarate-FA (PRENATAL MULTIVITAMIN) TABS Take 1 tablet by mouth daily.        Review of Systems  Constitutional: Negative for  fever, chills and malaise/fatigue.  Gastrointestinal: Negative for nausea, vomiting, abdominal pain, diarrhea and constipation.  Genitourinary: Negative for dysuria.  Neurological: Negative for weakness.  Also see HPI for more info.  Physical Exam   Blood pressure 121/80, pulse 102, temperature 98.2 F (36.8 C), temperature source Oral, resp. rate 20, height 5\' 2"  (1.575 m), weight 186 lb 2 oz (84.426 kg), last menstrual period 01/19/2012.  Physical Exam  Constitutional: She is oriented to person, place, and time. She appears well-developed and well-nourished. No distress.  HENT:  Head: Normocephalic.  Cardiovascular: Normal rate.   Respiratory: Effort normal.  GI: Soft. She exhibits no distension and no mass. There is no tenderness. There is no rebound and no guarding.  Genitourinary: Uterus normal. Vaginal discharge (scant thick white discharge, no pooling, no ferning) found.  Musculoskeletal: Normal range of motion.  Neurological: She is alert and oriented to person, place, and time.  Skin: Skin is warm and dry.  Psychiatric: She has a normal mood and affect.   Fetal heart rate reactive, rare contractions Dilation: Closed Effacement (%): Thick Station: -3 Exam by:: Artelia Laroche CNM  MAU Course  Procedures  MDM Sterile Speculum Exam:  Negative pooling, negative ferning  Assessment and Plan  A:  SIUP at [redacted]w[redacted]d       Vaginal discharge, possible urine leakage      No evidence for rupture of membranes  P:  Discussed with Dr Arlyce Dice      Discharge home      Reviewed labor precautions  [facility] 10/08/2012, 12:57 AM

## 2012-10-20 ENCOUNTER — Encounter (HOSPITAL_COMMUNITY): Payer: Self-pay | Admitting: *Deleted

## 2012-10-20 ENCOUNTER — Inpatient Hospital Stay (HOSPITAL_COMMUNITY)
Admission: AD | Admit: 2012-10-20 | Discharge: 2012-10-20 | Disposition: A | Discharge status: Home / Self Care | Payer: Medicaid Other | Source: Ambulatory Visit | Attending: Obstetrics and Gynecology | Admitting: Obstetrics and Gynecology

## 2012-10-20 DIAGNOSIS — O4700 False labor before 37 completed weeks of gestation, unspecified trimester: Secondary | ICD-10-CM | POA: Insufficient documentation

## 2012-10-20 LAB — OB RESULTS CONSOLE GC/CHLAMYDIA: Chlamydia: NEGATIVE

## 2012-10-20 LAB — OB RESULTS CONSOLE ANTIBODY SCREEN: Antibody Screen: NEGATIVE

## 2012-10-20 LAB — OB RESULTS CONSOLE HEPATITIS B SURFACE ANTIGEN: Hepatitis B Surface Ag: NEGATIVE

## 2012-10-20 LAB — OB RESULTS CONSOLE ABO/RH

## 2012-10-20 LAB — OB RESULTS CONSOLE RUBELLA ANTIBODY, IGM: Rubella: IMMUNE

## 2012-10-20 NOTE — MAU Note (Signed)
Patient states she has been having contractions every 2 minutes. Reports good fetal movement. Patient states she noticed green discharge on her panty liner and felt like something leaked when she had a strong contraction on the way to the hospital.

## 2012-10-20 NOTE — MAU Provider Note (Signed)
[name] is a 32 y.o. G2P1001 at [redacted]w[redacted]d who presents to MAU today with contractions and LOF. The patient states contractions q 2 minutes since 4pm. Leaking fluid, with some greenish discharge noted in underwear earlier today.   BP 122/83  Pulse 96  Temp(Src) 98.6 F (37 C) (Oral)  Resp 18  Ht 5\' 3"  (1.6 m)  Wt 186 lb 9.6 oz (84.641 kg)  BMI 33.06 kg/m2  SpO2 100%  LMP 01/19/2012 GENERAL: Well-developed, well-nourished female in no acute distress.  HEENT: Normocephalic, atraumatic.   LUNGS: Effort normal HEART: Regular rate  PELVIC: Normal external genitalia, with prominent venous structure at the perineum. Vagina is pink and ruggated. Small amount of thin, mucus discharge noted.   SKIN: Warm, dry and without erythema PSYCH: Normal mood and affect  Negative - pooling Negative - ferning  Report to RN to call MD for further instructions  [name], PA-C 10/20/2012 7:18 PM

## 2012-10-22 ENCOUNTER — Inpatient Hospital Stay (HOSPITAL_COMMUNITY)
Admission: AD | Admit: 2012-10-22 | Discharge: 2012-10-22 | Disposition: A | Discharge status: Home / Self Care | Payer: Medicaid Other | Source: Ambulatory Visit | Attending: Obstetrics and Gynecology | Admitting: Obstetrics and Gynecology

## 2012-10-22 ENCOUNTER — Inpatient Hospital Stay (HOSPITAL_COMMUNITY): Payer: Medicaid Other

## 2012-10-22 ENCOUNTER — Encounter (HOSPITAL_COMMUNITY): Payer: Self-pay | Admitting: *Deleted

## 2012-10-22 DIAGNOSIS — O368190 Decreased fetal movements, unspecified trimester, not applicable or unspecified: Secondary | ICD-10-CM | POA: Insufficient documentation

## 2012-10-22 DIAGNOSIS — O479 False labor, unspecified: Secondary | ICD-10-CM | POA: Insufficient documentation

## 2012-10-22 NOTE — MAU Note (Signed)
Patient state she has not felt baby move at all today. States the baby is usually active in the evening but was told by her MD that it is not normal for the baby not to move after eating or drinking. Patient states she has some irregular contractions. Denies bleeding or leaking.

## 2012-10-25 ENCOUNTER — Inpatient Hospital Stay (HOSPITAL_COMMUNITY)
Admission: AD | Admit: 2012-10-25 | Discharge: 2012-10-25 | Disposition: A | Discharge status: Home / Self Care | Payer: Medicaid Other | Source: Ambulatory Visit | Attending: Obstetrics & Gynecology | Admitting: Obstetrics & Gynecology

## 2012-10-25 ENCOUNTER — Encounter (HOSPITAL_COMMUNITY): Payer: Self-pay | Admitting: *Deleted

## 2012-10-25 DIAGNOSIS — O479 False labor, unspecified: Secondary | ICD-10-CM | POA: Insufficient documentation

## 2012-10-25 NOTE — MAU Note (Signed)
PT SAYS  HURT BAD  AT 0230.   VE - 2 CM ON 10-21-2012.    DENIES HSV AND MRSA.   HAS BEEN TESTED  FOR GBS-

## 2012-11-02 ENCOUNTER — Encounter (HOSPITAL_COMMUNITY): Payer: Self-pay | Admitting: *Deleted

## 2012-11-02 ENCOUNTER — Inpatient Hospital Stay (HOSPITAL_COMMUNITY)
Admission: AD | Admit: 2012-11-02 | Discharge: 2012-11-03 | Disposition: A | Discharge status: Home / Self Care | Payer: Medicaid Other | Source: Ambulatory Visit | Attending: Obstetrics and Gynecology | Admitting: Obstetrics and Gynecology

## 2012-11-02 DIAGNOSIS — O99891 Other specified diseases and conditions complicating pregnancy: Secondary | ICD-10-CM

## 2012-11-02 DIAGNOSIS — O479 False labor, unspecified: Secondary | ICD-10-CM

## 2012-11-02 DIAGNOSIS — R109 Unspecified abdominal pain: Secondary | ICD-10-CM

## 2012-11-02 DIAGNOSIS — S3991XA Unspecified injury of abdomen, initial encounter: Secondary | ICD-10-CM

## 2012-11-02 DIAGNOSIS — O368190 Decreased fetal movements, unspecified trimester, not applicable or unspecified: Secondary | ICD-10-CM

## 2012-11-02 DIAGNOSIS — Y92009 Unspecified place in unspecified non-institutional (private) residence as the place of occurrence of the external cause: Secondary | ICD-10-CM | POA: Insufficient documentation

## 2012-11-02 DIAGNOSIS — IMO0002 Reserved for concepts with insufficient information to code with codable children: Secondary | ICD-10-CM | POA: Insufficient documentation
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
## 2012-11-05 ENCOUNTER — Inpatient Hospital Stay (HOSPITAL_COMMUNITY)
Admission: AD | Admit: 2012-11-05 | Discharge: 2012-11-05 | Disposition: A | Discharge status: Home / Self Care | Payer: Medicaid Other | Source: Ambulatory Visit | Attending: Obstetrics and Gynecology | Admitting: Obstetrics and Gynecology

## 2012-11-05 ENCOUNTER — Encounter (HOSPITAL_COMMUNITY): Payer: Self-pay | Admitting: *Deleted

## 2012-11-05 DIAGNOSIS — O479 False labor, unspecified: Secondary | ICD-10-CM | POA: Insufficient documentation

## 2012-11-05 NOTE — MAU Note (Addendum)
Pt states she feels like her water broke at  2000.  Pt states she noticed fluid gush when she was sitting on the toilet after her shower. Pt denies contractions at this time

## 2012-11-09 ENCOUNTER — Inpatient Hospital Stay (HOSPITAL_COMMUNITY)
Admission: AD | Admit: 2012-11-09 | Discharge: 2012-11-11 | DRG: 775 | Disposition: A | Discharge status: Home / Self Care | Payer: Medicaid Other | Source: Ambulatory Visit | Attending: Obstetrics and Gynecology | Admitting: Obstetrics and Gynecology

## 2012-11-09 ENCOUNTER — Encounter (HOSPITAL_COMMUNITY): Payer: Self-pay | Admitting: Anesthesiology

## 2012-11-09 ENCOUNTER — Inpatient Hospital Stay (HOSPITAL_COMMUNITY): Payer: Medicaid Other | Admitting: Anesthesiology

## 2012-11-09 ENCOUNTER — Encounter (HOSPITAL_COMMUNITY): Payer: Self-pay | Admitting: *Deleted

## 2012-11-09 DIAGNOSIS — Z3480 Encounter for supervision of other normal pregnancy, unspecified trimester: Secondary | ICD-10-CM

## 2012-11-09 LAB — RPR: RPR Ser Ql: NONREACTIVE

## 2012-11-09 LAB — CBC
HCT: 38.4 % (ref 36.0–46.0)
Hemoglobin: 12.6 g/dL (ref 12.0–15.0)
MCH: 29.2 pg (ref 26.0–34.0)
Platelets: 252 10*3/uL (ref 150–400)
RDW: 14.5 % (ref 11.5–15.5)

## 2012-11-09 MED ORDER — ONDANSETRON HCL 4 MG/2ML IJ SOLN
4.0000 mg | Freq: Four times a day (QID) | INTRAMUSCULAR | Status: DC | PRN
  Administered 2012-11-09: 4 mg via INTRAVENOUS
  Filled 2012-11-09: qty 2

## 2012-11-09 MED ORDER — OXYCODONE-ACETAMINOPHEN 5-325 MG PO TABS: 1.0000 | ORAL_TABLET | ORAL | Status: DC | PRN

## 2012-11-09 MED ORDER — LACTATED RINGERS IV SOLN: 500.0000 mL | INTRAVENOUS | Status: DC | PRN

## 2012-11-09 MED ORDER — TERBUTALINE SULFATE 1 MG/ML IJ SOLN: 0.2500 mg | Freq: Once | INTRAMUSCULAR | Status: AC | PRN

## 2012-11-09 MED ORDER — FENTANYL 2.5 MCG/ML BUPIVACAINE 1/10 % EPIDURAL INFUSION (WH - ANES): 14.0000 mL/h | INTRAMUSCULAR | Status: DC | PRN

## 2012-11-09 MED ORDER — OXYTOCIN BOLUS FROM INFUSION
500.0000 mL | INTRAVENOUS | Status: DC
  Administered 2012-11-09: 500 mL via INTRAVENOUS

## 2012-11-09 MED ORDER — ACETAMINOPHEN 325 MG PO TABS: 650.0000 mg | ORAL_TABLET | ORAL | Status: DC | PRN

## 2012-11-09 MED ORDER — PHENYLEPHRINE 40 MCG/ML (10ML) SYRINGE FOR IV PUSH (FOR BLOOD PRESSURE SUPPORT)
80.0000 ug | PREFILLED_SYRINGE | INTRAVENOUS | Status: DC | PRN
  Filled 2012-11-09: qty 2

## 2012-11-09 MED ORDER — OXYTOCIN 40 UNITS IN LACTATED RINGERS INFUSION - SIMPLE MED
62.5000 mL/h | INTRAVENOUS | Status: DC
  Filled 2012-11-09: qty 1000

## 2012-11-09 MED ORDER — EPHEDRINE 5 MG/ML INJ
10.0000 mg | INTRAVENOUS | Status: DC | PRN
  Filled 2012-11-09: qty 4
  Filled 2012-11-09: qty 2

## 2012-11-09 MED ORDER — LACTATED RINGERS IV SOLN: 500.0000 mL | Freq: Once | INTRAVENOUS | Status: DC

## 2012-11-09 MED ORDER — EPHEDRINE 5 MG/ML INJ
10.0000 mg | INTRAVENOUS | Status: DC | PRN
  Filled 2012-11-09: qty 2

## 2012-11-09 MED ORDER — CITRIC ACID-SODIUM CITRATE 334-500 MG/5ML PO SOLN: 30.0000 mL | ORAL | Status: DC | PRN

## 2012-11-09 MED ORDER — LIDOCAINE HCL (PF) 1 % IJ SOLN
30.0000 mL | INTRAMUSCULAR | Status: DC | PRN
  Filled 2012-11-09 (×2): qty 30

## 2012-11-09 MED ORDER — PHENYLEPHRINE 40 MCG/ML (10ML) SYRINGE FOR IV PUSH (FOR BLOOD PRESSURE SUPPORT)
80.0000 ug | PREFILLED_SYRINGE | INTRAVENOUS | Status: DC | PRN
  Filled 2012-11-09: qty 2
  Filled 2012-11-09: qty 5

## 2012-11-09 MED ORDER — OXYTOCIN 40 UNITS IN LACTATED RINGERS INFUSION - SIMPLE MED
1.0000 m[IU]/min | INTRAVENOUS | Status: DC
  Administered 2012-11-09: 2 m[IU]/min via INTRAVENOUS

## 2012-11-09 MED ORDER — FENTANYL 2.5 MCG/ML BUPIVACAINE 1/10 % EPIDURAL INFUSION (WH - ANES)
INTRAMUSCULAR | Status: DC | PRN
  Administered 2012-11-09: 14 mL/h via EPIDURAL

## 2012-11-09 MED ORDER — LACTATED RINGERS IV SOLN
INTRAVENOUS | Status: DC
  Administered 2012-11-09: 18:00:00 via INTRAVENOUS

## 2012-11-09 MED ORDER — FLEET ENEMA 7-19 GM/118ML RE ENEM: 1.0000 | ENEMA | RECTAL | Status: DC | PRN

## 2012-11-09 MED ORDER — FENTANYL 2.5 MCG/ML BUPIVACAINE 1/10 % EPIDURAL INFUSION (WH - ANES)
14.0000 mL/h | INTRAMUSCULAR | Status: DC | PRN
  Filled 2012-11-09: qty 125

## 2012-11-09 MED ORDER — LIDOCAINE HCL (PF) 1 % IJ SOLN
INTRAMUSCULAR | Status: DC | PRN
  Administered 2012-11-09 (×2): 3 mL

## 2012-11-09 MED ORDER — IBUPROFEN 600 MG PO TABS: 600.0000 mg | ORAL_TABLET | Freq: Four times a day (QID) | ORAL | Status: DC | PRN

## 2012-11-09 NOTE — Anesthesia Preprocedure Evaluation (Signed)
Anesthesia Evaluation  Patient identified by MRN, date of birth, ID band Patient awake    Reviewed: Allergy & Precautions, H&P , NPO status , Patient's Chart, lab work & pertinent test results  Airway Mallampati: II TM Distance: >3 FB Neck ROM: Full    Dental   Pulmonary neg pulmonary ROS,  breath sounds clear to auscultation        Cardiovascular negative cardio ROS  Rhythm:Regular Rate:Normal     Neuro/Psych  Headaches, PSYCHIATRIC DISORDERS Anxiety Depression    GI/Hepatic negative GI ROS, Neg liver ROS,   Endo/Other  negative endocrine ROS  Renal/GU negative Renal ROS     Musculoskeletal negative musculoskeletal ROS (+)   Abdominal   Peds  Hematology negative hematology ROS (+)   Anesthesia Other Findings   Reproductive/Obstetrics (+) Pregnancy                           Anesthesia Physical Anesthesia Plan  ASA: II  Anesthesia Plan: Epidural   Post-op Pain Management:    Induction:   Airway Management Planned:   Additional Equipment:   Intra-op Plan:   Post-operative Plan:   Informed Consent: I have reviewed the patients History and Physical, chart, labs and discussed the procedure including the risks, benefits and alternatives for the proposed anesthesia with the patient or authorized representative who has indicated his/her understanding and acceptance.     Plan Discussed with:   Anesthesia Plan Comments:         Anesthesia Quick Evaluation

## 2012-11-09 NOTE — H&P (Signed)
32 y.o. [redacted]w[redacted]d  G2P1001 comes in c/o labor.  Otherwise has good fetal movement and no bleeding.  Past Medical History  Diagnosis Date  . Headache   . Urinary tract infection   . Human papilloma virus   . TMJ arthralgia   . Anxiety   . Depression   . OCD (obsessive compulsive disorder)     Past Surgical History  Procedure Laterality Date  . Tonsillectomy and adenoidectomy    . Adenoidectomy      OB History   Grav Para Term Preterm Abortions TAB SAB Ect Mult Living   2 1 1  0 0 0 0 0 0 1     # Outc Date GA Lbr Len/2nd Wgt Sex Del Anes PTL Lv   1 TRM      SVD   Yes   2 CUR               History   Social History  . Marital Status: Married    Spouse Name: N/A    Number of Children: N/A  . Years of Education: N/A   Occupational History  . Not on file.   Social History Main Topics  . Smoking status: Never Smoker   . Smokeless tobacco: Never Used  . Alcohol Use: No  . Drug Use: No  . Sexually Active: Not Currently    Birth Control/ Protection: None   Other Topics Concern  . Not on file   Social History Narrative  . No narrative on file   Benadryl and Sulfa antibiotics    Prenatal Transfer Tool  Maternal Diabetes: No Genetic Screening: Normal Maternal Ultrasounds/Referrals: Normal Fetal Ultrasounds or other Referrals:  None Maternal Substance Abuse:  No Significant Maternal Medications:  None Significant Maternal Lab Results: None  Other PNC:Pt is RH neg but she said FOB is also neg.    Filed Vitals:   11/09/12 1356  BP: 142/90  Pulse: 84  Temp: 98.3 F (36.8 C)  Resp: 18     Lungs/Cor:  NAD Abdomen:  soft, gravid Ex:  no cords, erythema SVE:  5/90/-2 per nurse on admission. FHTs:  120s, good STV, NST R Toco:  q 2-4   A/P   Term labor.  GBS neg.  Debra Herrera A

## 2012-11-09 NOTE — MAU Note (Signed)
Pt reports having ctx since 830 . Denies SROM or BLeeding. good fetal movement reported

## 2012-11-09 NOTE — Anesthesia Procedure Notes (Signed)
Epidural Patient location during procedure: OB Start time: 11/09/2012 5:55 PM End time: 11/09/2012 6:15 PM  Staffing Anesthesiologist: Lewie Loron R  Preanesthetic Checklist Completed: patient identified, pre-op evaluation, timeout performed, IV checked, risks and benefits discussed and monitors and equipment checked  Epidural Patient position: sitting Prep: site prepped and draped and DuraPrep Patient monitoring: heart rate, continuous pulse ox and blood pressure Approach: midline Injection technique: LOR air and LOR saline  Needle:  Needle type: Tuohy  Needle gauge: 17 G Needle length: 9 cm Needle insertion depth: 6 cm Catheter type: closed end flexible Catheter size: 19 Gauge Catheter at skin depth: 12 cm Test dose: negative  Assessment Sensory level: T8 Events: blood not aspirated, injection not painful, no injection resistance, negative IV test and no paresthesia  Additional Notes Reason for block:procedure for pain

## 2012-11-10 ENCOUNTER — Encounter (HOSPITAL_COMMUNITY): Payer: Self-pay | Admitting: *Deleted

## 2012-11-10 LAB — CBC
HCT: 33.5 % — ABNORMAL LOW (ref 36.0–46.0)
Hemoglobin: 11.2 g/dL — ABNORMAL LOW (ref 12.0–15.0)
MCH: 29.7 pg (ref 26.0–34.0)
MCV: 88.9 fL (ref 78.0–100.0)
RBC: 3.77 MIL/uL — ABNORMAL LOW (ref 3.87–5.11)
WBC: 13.6 10*3/uL — ABNORMAL HIGH (ref 4.0–10.5)

## 2012-11-10 MED ORDER — PRENATAL MULTIVITAMIN CH: 1.0000 | ORAL_TABLET | Freq: Every day | ORAL | Status: DC

## 2012-11-10 MED ORDER — ONDANSETRON HCL 4 MG/2ML IJ SOLN: 4.0000 mg | INTRAMUSCULAR | Status: DC | PRN

## 2012-11-10 MED ORDER — IBUPROFEN 800 MG PO TABS
800.0000 mg | ORAL_TABLET | Freq: Three times a day (TID) | ORAL | Status: DC
  Administered 2012-11-10 – 2012-11-11 (×4): 800 mg via ORAL
  Filled 2012-11-10 (×4): qty 1

## 2012-11-10 MED ORDER — SODIUM CHLORIDE 0.9 % IV SOLN: 250.0000 mL | INTRAVENOUS | Status: DC | PRN

## 2012-11-10 MED ORDER — LANOLIN HYDROUS EX OINT: TOPICAL_OINTMENT | CUTANEOUS | Status: DC | PRN

## 2012-11-10 MED ORDER — BENZOCAINE-MENTHOL 20-0.5 % EX AERO
1.0000 "application " | INHALATION_SPRAY | CUTANEOUS | Status: DC | PRN
  Administered 2012-11-10: 1 via TOPICAL
  Filled 2012-11-10: qty 56

## 2012-11-10 MED ORDER — DIPHENHYDRAMINE HCL 25 MG PO CAPS: 25.0000 mg | ORAL_CAPSULE | Freq: Four times a day (QID) | ORAL | Status: DC | PRN

## 2012-11-10 MED ORDER — MEASLES, MUMPS & RUBELLA VAC ~~LOC~~ INJ
0.5000 mL | INJECTION | Freq: Once | SUBCUTANEOUS | Status: DC
  Filled 2012-11-10: qty 0.5

## 2012-11-10 MED ORDER — ESCITALOPRAM OXALATE 20 MG PO TABS
20.0000 mg | ORAL_TABLET | Freq: Every day | ORAL | Status: DC
  Administered 2012-11-10: 20 mg via ORAL
  Filled 2012-11-10 (×2): qty 1

## 2012-11-10 MED ORDER — SENNOSIDES-DOCUSATE SODIUM 8.6-50 MG PO TABS
2.0000 | ORAL_TABLET | Freq: Every day | ORAL | Status: DC
  Administered 2012-11-10: 2 via ORAL

## 2012-11-10 MED ORDER — CYCLOBENZAPRINE HCL 10 MG PO TABS
10.0000 mg | ORAL_TABLET | Freq: Two times a day (BID) | ORAL | Status: DC | PRN
  Filled 2012-11-10: qty 1

## 2012-11-10 MED ORDER — FERROUS SULFATE 325 (65 FE) MG PO TABS
325.0000 mg | ORAL_TABLET | Freq: Two times a day (BID) | ORAL | Status: DC
  Administered 2012-11-10 – 2012-11-11 (×3): 325 mg via ORAL
  Filled 2012-11-10 (×3): qty 1

## 2012-11-10 MED ORDER — WITCH HAZEL-GLYCERIN EX PADS: 1.0000 "application " | MEDICATED_PAD | CUTANEOUS | Status: DC | PRN

## 2012-11-10 MED ORDER — ZOLPIDEM TARTRATE 5 MG PO TABS: 5.0000 mg | ORAL_TABLET | Freq: Every evening | ORAL | Status: DC | PRN

## 2012-11-10 MED ORDER — MAGNESIUM HYDROXIDE 400 MG/5ML PO SUSP
30.0000 mL | ORAL | Status: DC | PRN
  Administered 2012-11-10: 30 mL via ORAL
  Filled 2012-11-10: qty 30

## 2012-11-10 MED ORDER — SODIUM CHLORIDE 0.9 % IJ SOLN: 3.0000 mL | Freq: Two times a day (BID) | INTRAMUSCULAR | Status: DC

## 2012-11-10 MED ORDER — METHYLERGONOVINE MALEATE 0.2 MG/ML IJ SOLN: 0.2000 mg | INTRAMUSCULAR | Status: DC | PRN

## 2012-11-10 MED ORDER — METHYLERGONOVINE MALEATE 0.2 MG PO TABS
0.2000 mg | ORAL_TABLET | ORAL | Status: DC | PRN
  Administered 2012-11-10 (×3): 0.2 mg via ORAL
  Filled 2012-11-10 (×3): qty 1

## 2012-11-10 MED ORDER — SODIUM CHLORIDE 0.9 % IJ SOLN: 3.0000 mL | INTRAMUSCULAR | Status: DC | PRN

## 2012-11-10 MED ORDER — TETANUS-DIPHTH-ACELL PERTUSSIS 5-2.5-18.5 LF-MCG/0.5 IM SUSP
0.5000 mL | Freq: Once | INTRAMUSCULAR | Status: AC
  Administered 2012-11-10: 0.5 mL via INTRAMUSCULAR
  Filled 2012-11-10: qty 0.5

## 2012-11-10 MED ORDER — OXYCODONE-ACETAMINOPHEN 5-325 MG PO TABS: 1.0000 | ORAL_TABLET | ORAL | Status: DC | PRN

## 2012-11-10 MED ORDER — DIBUCAINE 1 % RE OINT: 1.0000 "application " | TOPICAL_OINTMENT | RECTAL | Status: DC | PRN

## 2012-11-10 MED ORDER — SIMETHICONE 80 MG PO CHEW
80.0000 mg | CHEWABLE_TABLET | ORAL | Status: DC | PRN
  Administered 2012-11-10 – 2012-11-11 (×2): 80 mg via ORAL

## 2012-11-10 MED ORDER — ONDANSETRON HCL 4 MG PO TABS: 4.0000 mg | ORAL_TABLET | ORAL | Status: DC | PRN

## 2012-11-10 NOTE — Progress Notes (Signed)
UR chart review completed.  

## 2012-11-10 NOTE — Anesthesia Postprocedure Evaluation (Signed)
Anesthesia Post Note  Patient: Debra Herrera  Procedure(s) Performed: * No procedures listed *  Anesthesia type: Epidural  Patient location: Mother/Baby  Post pain: Pain level controlled  Post assessment: Post-op Vital signs reviewed  Last Vitals:  Filed Vitals:   11/10/12 0530  BP: 115/70  Pulse: 82  Temp: 36.8 C  Resp: 20    Post vital signs: Reviewed  Level of consciousness: awake  Complications: No apparent anesthesia complications

## 2012-11-10 NOTE — Progress Notes (Signed)
Patient is eating, ambulating, voiding.  Pain control is good.  Appropriate lochia.  No complaints.  Filed Vitals:   11/09/12 2349 11/10/12 0015 11/10/12 0115 11/10/12 0530  BP: 131/83 127/81 130/75 115/70  Pulse: 82 82 87 82  Temp: 98.5 F (36.9 C) 98.5 F (36.9 C) 99.7 F (37.6 C) 98.2 F (36.8 C)  TempSrc: Axillary Oral Oral Oral  Resp: 18 20 20 20   Height:      Weight:      SpO2:        Fundus firm Perineum without swelling. No CT  Lab Results  Component Value Date   WBC 13.6* 11/10/2012   HGB 11.2* 11/10/2012   HCT 33.5* 11/10/2012   MCV 88.9 11/10/2012   PLT 197 11/10/2012    --/--/O NEG (05/25 1405)  A/P Post partum day 2.  Routine care.  Expect d/c 5/27.   Office circ  [place], Luther Parody

## 2012-11-10 NOTE — Progress Notes (Signed)
Patient was referred for history of depression/anxiety. * Referral screened out by Clinical Social Worker because none of the following criteria appear to apply: ~ History of anxiety/depression during this pregnancy, or of post-partum depression. ~ Diagnosis of anxiety and/or depression within last 3 years ~ History of depression due to pregnancy loss/loss of child OR * Patient's symptoms currently being treated with medication and/or therapy. Please contact the Clinical Social Worker if needs arise, or if patient requests.  Patient has Rx for Lexapro. 

## 2012-11-11 MED ORDER — OXYCODONE-ACETAMINOPHEN 10-325 MG PO TABS: 1.0000 | ORAL_TABLET | ORAL | Status: DC | PRN

## 2012-11-11 NOTE — Discharge Summary (Signed)
Obstetric Discharge Summary Reason for Admission: onset of labor Prenatal Procedures: ultrasound Intrapartum Procedures: spontaneous vaginal delivery Postpartum Procedures: none Complications-Operative and Postpartum: 2nd degree perineal laceration Hemoglobin  Date Value Range Status  11/10/2012 11.2* 12.0 - 15.0 g/dL Final     HCT  Date Value Range Status  11/10/2012 33.5* 36.0 - 46.0 % Final    Physical Exam:  General: alert Lochia: appropriate Uterine Fundus: firm  Discharge Diagnoses: Term Pregnancy-delivered  Discharge Information: Date: 11/11/2012 Activity: pelvic rest Diet: routine Medications: PNV, Ibuprofen and Percocet Condition: stable Instructions: refer to practice specific booklet Discharge to: home Follow-up Information   Follow up with HORVATH,MICHELLE A, MD. Schedule an appointment as soon as possible for a visit in 4 weeks.   Contact information:   [address] [place] [address] [phone]       Newborn Data: Live born female  Birth Weight: 7 lb 8.5 oz (3416 g) APGAR: 7, 9  Home with mother.  ANDERSON,MARK E 11/11/2012, 8:28 AM

## 2012-11-11 NOTE — Progress Notes (Signed)
PPD#2 Pt without complaints. Lochia-wnl IMP/ doing well. PLAN/ Will discharge.

## 2013-02-01 ENCOUNTER — Encounter (HOSPITAL_COMMUNITY): Payer: Self-pay | Admitting: Emergency Medicine

## 2013-02-01 ENCOUNTER — Emergency Department (HOSPITAL_COMMUNITY)
Admission: EM | Admit: 2013-02-01 | Discharge: 2013-02-01 | Disposition: A | Discharge status: Home / Self Care | Payer: Medicaid Other | Attending: Emergency Medicine | Admitting: Emergency Medicine

## 2013-02-01 ENCOUNTER — Emergency Department (HOSPITAL_COMMUNITY): Payer: Medicaid Other

## 2013-02-01 DIAGNOSIS — Z79899 Other long term (current) drug therapy: Secondary | ICD-10-CM | POA: Insufficient documentation

## 2013-02-01 DIAGNOSIS — T8389XA Other specified complication of genitourinary prosthetic devices, implants and grafts, initial encounter: Secondary | ICD-10-CM | POA: Insufficient documentation

## 2013-02-01 DIAGNOSIS — F329 Major depressive disorder, single episode, unspecified: Secondary | ICD-10-CM | POA: Insufficient documentation

## 2013-02-01 DIAGNOSIS — R109 Unspecified abdominal pain: Secondary | ICD-10-CM | POA: Insufficient documentation

## 2013-02-01 DIAGNOSIS — Z8619 Personal history of other infectious and parasitic diseases: Secondary | ICD-10-CM | POA: Insufficient documentation

## 2013-02-01 DIAGNOSIS — F3289 Other specified depressive episodes: Secondary | ICD-10-CM | POA: Insufficient documentation

## 2013-02-01 DIAGNOSIS — Z3202 Encounter for pregnancy test, result negative: Secondary | ICD-10-CM | POA: Insufficient documentation

## 2013-02-01 DIAGNOSIS — N939 Abnormal uterine and vaginal bleeding, unspecified: Secondary | ICD-10-CM

## 2013-02-01 DIAGNOSIS — N949 Unspecified condition associated with female genital organs and menstrual cycle: Secondary | ICD-10-CM | POA: Insufficient documentation

## 2013-02-01 DIAGNOSIS — F411 Generalized anxiety disorder: Secondary | ICD-10-CM | POA: Insufficient documentation

## 2013-02-01 DIAGNOSIS — F429 Obsessive-compulsive disorder, unspecified: Secondary | ICD-10-CM | POA: Insufficient documentation

## 2013-02-01 DIAGNOSIS — Z87440 Personal history of urinary (tract) infections: Secondary | ICD-10-CM | POA: Insufficient documentation

## 2013-02-01 DIAGNOSIS — Y849 Medical procedure, unspecified as the cause of abnormal reaction of the patient, or of later complication, without mention of misadventure at the time of the procedure: Secondary | ICD-10-CM | POA: Insufficient documentation

## 2013-02-01 DIAGNOSIS — N898 Other specified noninflammatory disorders of vagina: Secondary | ICD-10-CM | POA: Insufficient documentation

## 2013-02-01 LAB — CBC WITH DIFFERENTIAL/PLATELET
Eosinophils Absolute: 0.1 10*3/uL (ref 0.0–0.7)
Eosinophils Relative: 2 % (ref 0–5)
Hemoglobin: 12 g/dL (ref 12.0–15.0)
Lymphocytes Relative: 34 % (ref 12–46)
Lymphs Abs: 2.1 10*3/uL (ref 0.7–4.0)
MCH: 28.7 pg (ref 26.0–34.0)
MCV: 86.6 fL (ref 78.0–100.0)
Monocytes Relative: 5 % (ref 3–12)
Neutrophils Relative %: 60 % (ref 43–77)
Platelets: 251 10*3/uL (ref 150–400)
RBC: 4.18 MIL/uL (ref 3.87–5.11)
WBC: 6.2 10*3/uL (ref 4.0–10.5)

## 2013-02-01 LAB — URINALYSIS, ROUTINE W REFLEX MICROSCOPIC
Bilirubin Urine: NEGATIVE
Glucose, UA: NEGATIVE mg/dL
Specific Gravity, Urine: 1.024 (ref 1.005–1.030)
pH: 6 (ref 5.0–8.0)

## 2013-02-01 LAB — COMPREHENSIVE METABOLIC PANEL
ALT: 18 U/L (ref 0–35)
AST: 17 U/L (ref 0–37)
Alkaline Phosphatase: 74 U/L (ref 39–117)
CO2: 27 mEq/L (ref 19–32)
Calcium: 9.4 mg/dL (ref 8.4–10.5)
Chloride: 104 mEq/L (ref 96–112)
GFR calc Af Amer: >90 mL/min (ref >90)
GFR calc non Af Amer: >90 mL/min (ref >90)
Glucose, Bld: 96 mg/dL (ref 70–99)
Potassium: 4 mEq/L (ref 3.5–5.1)
Sodium: 138 mEq/L (ref 135–145)
Total Bilirubin: 0.2 mg/dL — ABNORMAL LOW (ref 0.3–1.2)

## 2013-02-01 LAB — WET PREP, GENITAL: Clue Cells Wet Prep HPF POC: NONE SEEN

## 2013-02-01 LAB — URINE MICROSCOPIC-ADD ON

## 2013-02-01 MED ORDER — HYDROCODONE-ACETAMINOPHEN 5-325 MG PO TABS: 1.0000 | ORAL_TABLET | Freq: Four times a day (QID) | ORAL | Status: DC | PRN

## 2013-02-01 MED ORDER — HYDROCODONE-ACETAMINOPHEN 5-325 MG PO TABS
1.0000 | ORAL_TABLET | Freq: Once | ORAL | Status: AC
  Administered 2013-02-01: 1 via ORAL
  Filled 2013-02-01: qty 1

## 2013-02-01 NOTE — ED Provider Notes (Signed)
Medical screening examination/treatment/procedure(s) were performed by non-physician practitioner and as supervising physician I was immediately available for consultation/collaboration.  Layla Maw Jachin Coury, DO 02/01/13 1713

## 2013-02-01 NOTE — ED Notes (Signed)
Pt c/o left sided abdominal cramping onset July 31st after having an IUD placed. Pt reports abnormal vaginal bleed.

## 2013-02-01 NOTE — ED Notes (Signed)
Pt c/o cramping to left lower abd and heavy bleeding. Pt states she had a baby on May 25th and never completely stopped bleeding. Pt states she had a IUD placed on July 31st. Pt states she called her obgyn about the cramping and was told to take ibuprofen, and states she had a ultrasound to verify placement of IUD. Pt rates pain 6/10.

## 2013-02-01 NOTE — ED Provider Notes (Signed)
CSN: [id number]     Arrival date & time 02/01/13  1229 History     First MD Initiated Contact with Patient 02/01/13 1354     Chief Complaint  Patient presents with  . Abdominal Cramping   (Consider location/radiation/quality/duration/timing/severity/associated sxs/prior Treatment) HPI Patient is a 32 year old female who presented to the emergency department with complaint of lower cramping and vaginal bleeding. Patient states she delivered in May of this year, the normal transvaginal delivery with no complications other episiotomy. Patient states she has hot vaginal bleeding ever since delivery. Patient was seen on 01/15/2013 by her OB/GYN and she had an IUD placed at that time. Patient states that she continues to bleed after an IUD placement and now also having lower abdominal cramping and gradually worsening since then. Patient states bleeding is intermittently heavy to light. Patient states pain is constant and worsened with movement. She states she did her own cervical check yesterday was able to feel the strings of an IUD. Patient states she has also a family history of dermoid cysts and states her pain is mainly on the left side. Patient denies any fever chills malaise. She denies any nausea or vomiting. No changes in bowels. Denies any urinary symptoms. Past Medical History  Diagnosis Date  . Headache(784.0)   . Urinary tract infection   . Human papilloma virus   . TMJ arthralgia   . Anxiety   . Depression   . OCD (obsessive compulsive disorder)    Past Surgical History  Procedure Laterality Date  . Tonsillectomy and adenoidectomy    . Adenoidectomy     Family History  Problem Relation Age of Onset  . Other Neg Hx   . Hypertension Mother   . Diabetes Father   . Hypertension Father    History  Substance Use Topics  . Smoking status: Never Smoker   . Smokeless tobacco: Never Used  . Alcohol Use: No   OB History   Grav Para Term Preterm Abortions TAB SAB Ect Mult Living    2 2 2  0 0 0 0 0 0 2     Review of Systems  Constitutional: Negative for fever and chills.  Respiratory: Negative.   Cardiovascular: Negative.   Gastrointestinal: Positive for abdominal pain. Negative for nausea and vomiting.  Genitourinary: Positive for vaginal bleeding and pelvic pain. Negative for dysuria, urgency and vaginal discharge.  Neurological: Negative for dizziness, weakness and light-headedness.  All other systems reviewed and are negative.    Allergies  Benadryl and Sulfa antibiotics  Home Medications   Current Outpatient Rx  Name  Route  Sig  Dispense  Refill  . escitalopram (LEXAPRO) 20 MG tablet   Oral   Take 20 mg by mouth daily.         Marland Kitchen ibuprofen (ADVIL,MOTRIN) 200 MG tablet   Oral   Take 600 mg by mouth every 6 (six) hours as needed for pain.          BP 121/81  Pulse 84  Temp(Src) 98.3 F (36.8 C) (Oral)  Resp 16  Ht 5\' 4"  (1.626 m)  Wt 175 lb (79.379 kg)  BMI 30.02 kg/m2  SpO2 97%  Breastfeeding? No Physical Exam  Nursing note and vitals reviewed. Constitutional: She is oriented to person, place, and time. She appears well-developed and well-nourished. No distress.  Eyes: Conjunctivae are normal.  Neck: Neck supple.  Cardiovascular: Normal rate, regular rhythm and normal heart sounds.   Pulmonary/Chest: Effort normal and breath sounds normal. No respiratory  distress.  Abdominal: Soft. Bowel sounds are normal. She exhibits no distension. There is tenderness. There is no rebound and no guarding.  Suprapubic tenderness. No cva tenderness  Genitourinary:  Normal external genitalia. Moderate blood in the vaginal canal. Cervix appears normal with IUD strings visualized. Uterine tenderness present. No adnexal tenderness. No cervical motion tenderness  Neurological: She is alert and oriented to person, place, and time.  Skin: Skin is warm and dry.    ED Course   Procedures (including critical care time)  Labs Reviewed  WET PREP,  GENITAL - Abnormal; Notable for the following:    WBC, Wet Prep HPF POC FEW (*)    All other components within normal limits  URINALYSIS, ROUTINE W REFLEX MICROSCOPIC - Abnormal; Notable for the following:    Color, Urine AMBER (*)    APPearance CLOUDY (*)    Hgb urine dipstick LARGE (*)    Leukocytes, UA SMALL (*)    All other components within normal limits  COMPREHENSIVE METABOLIC PANEL - Abnormal; Notable for the following:    Albumin 3.3 (*)    Total Bilirubin 0.2 (*)    All other components within normal limits  GC/CHLAMYDIA PROBE AMP  CBC WITH DIFFERENTIAL  PREGNANCY, URINE  URINE MICROSCOPIC-ADD ON   US Transvaginal Non-ob  02/01/2013   CLINICAL DATA:  Left lower quadrant pain and cramping. Eleven weeks postpartum. IUD.  EXAM: TRANSABDOMINAL AND TRANSVAGINAL ULTRASOUND OF PELVIS  TECHNIQUE: Both transabdominal and transvaginal ultrasound examinations of the pelvis were performed. Transabdominal technique was performed for global imaging of the pelvis including uterus, ovaries, adnexal regions, and pelvic cul-de-sac. It was necessary to proceed with endovaginal exam following the transabdominal exam to visualize the endometrium, IUD location, and ovaries.  COMPARISON:  None  FINDINGS: Uterus: 8.6 x 4.6 x 6.3 cm. No fibroids or other uterine mass identified.  Endometrium: IUD visualize in endometrial cavity. Double-layer thickness measures 8 mm transvaginally. No focal lesion visualized.  Right ovary: 3.1 x 1.8 x 2.0 cm. Normal appearance. No adnexal mass identified.  Left ovary: 3.7 x 3.0 x 2.8 cm. Simple cyst measuring 2.8 cm, most likely physiologic. No adnexal mass identified.  Other findings:  No free fluid  IMPRESSION: IUD visualized in endometrial cavity. No evidence of fibroids or other uterine abnormality.  Normal ovaries. No adnexal mass identified.   Electronically Signed   By: Myles Rosenthal   On: 02/01/2013 15:28   US Pelvis Complete  02/01/2013   CLINICAL DATA:  Left lower  quadrant pain and cramping. Eleven weeks postpartum. IUD.  EXAM: TRANSABDOMINAL AND TRANSVAGINAL ULTRASOUND OF PELVIS  TECHNIQUE: Both transabdominal and transvaginal ultrasound examinations of the pelvis were performed. Transabdominal technique was performed for global imaging of the pelvis including uterus, ovaries, adnexal regions, and pelvic cul-de-sac. It was necessary to proceed with endovaginal exam following the transabdominal exam to visualize the endometrium, IUD location, and ovaries.  COMPARISON:  None  FINDINGS: Uterus: 8.6 x 4.6 x 6.3 cm. No fibroids or other uterine mass identified.  Endometrium: IUD visualize in endometrial cavity. Double-layer thickness measures 8 mm transvaginally. No focal lesion visualized.  Right ovary: 3.1 x 1.8 x 2.0 cm. Normal appearance. No adnexal mass identified.  Left ovary: 3.7 x 3.0 x 2.8 cm. Simple cyst measuring 2.8 cm, most likely physiologic. No adnexal mass identified.  Other findings:  No free fluid  IMPRESSION: IUD visualized in endometrial cavity. No evidence of fibroids or other uterine abnormality.  Normal ovaries. No adnexal mass identified.  Electronically Signed   By: Myles Rosenthal   On: 02/01/2013 15:28   1. Abdominal pain   2. Vaginal bleeding     MDM  Patient with vaginal bleeding since delivery in May. Hemoglobin is normal hemodynamically stable a shunt as having pelvic cramping since having IUD inserted about 17 days ago. Her exam is unremarkable other than moderate bleeding in the vaginal canal. Ultrasound performed to confirm position of the IUD and it is unremarkable. There is no other ultrasound findings to explain patient's pain. She will be discharged home with pain medication and close followup with her OB/GYN doctor. I do suspect her bleeding and cramping may be deep to the IUD  Filed Vitals:   02/01/13 1234 02/01/13 1253 02/01/13 1624  BP: 121/81  112/78  Pulse: 84  80  Temp: 98.3 F (36.8 C)  97.2 F (36.2 C)  TempSrc: Oral   Oral  Resp: 16  18  Height:  5\' 4"  (1.626 m)   Weight:  175 lb (79.379 kg)   SpO2: 97%  100%     Lottie Mussel, PA-C 02/01/13 1642

## 2013-02-03 LAB — GC/CHLAMYDIA PROBE AMP: GC Probe RNA: NEGATIVE

## 2013-05-04 ENCOUNTER — Encounter (HOSPITAL_COMMUNITY): Payer: Self-pay | Admitting: Emergency Medicine

## 2013-05-04 ENCOUNTER — Emergency Department (HOSPITAL_COMMUNITY)
Admission: EM | Admit: 2013-05-04 | Discharge: 2013-05-05 | Disposition: A | Discharge status: Home / Self Care | Payer: Medicaid Other | Attending: Emergency Medicine | Admitting: Emergency Medicine

## 2013-05-04 ENCOUNTER — Emergency Department (HOSPITAL_COMMUNITY): Payer: Medicaid Other

## 2013-05-04 DIAGNOSIS — M26629 Arthralgia of temporomandibular joint, unspecified side: Secondary | ICD-10-CM | POA: Insufficient documentation

## 2013-05-04 DIAGNOSIS — F411 Generalized anxiety disorder: Secondary | ICD-10-CM | POA: Insufficient documentation

## 2013-05-04 DIAGNOSIS — L539 Erythematous condition, unspecified: Secondary | ICD-10-CM | POA: Insufficient documentation

## 2013-05-04 DIAGNOSIS — F429 Obsessive-compulsive disorder, unspecified: Secondary | ICD-10-CM | POA: Insufficient documentation

## 2013-05-04 DIAGNOSIS — M25461 Effusion, right knee: Secondary | ICD-10-CM

## 2013-05-04 DIAGNOSIS — M25469 Effusion, unspecified knee: Secondary | ICD-10-CM | POA: Insufficient documentation

## 2013-05-04 LAB — SYNOVIAL CELL COUNT + DIFF, W/ CRYSTALS
Eosinophils-Synovial: 0 % (ref 0–1)
Monocyte-Macrophage-Synovial Fluid: 92 % — ABNORMAL HIGH (ref 50–90)
Other Cells-SYN: 0
WBC, Synovial: 453 /mm3 — ABNORMAL HIGH (ref 0–200)

## 2013-05-04 LAB — GRAM STAIN

## 2013-05-04 MED ORDER — OXYCODONE-ACETAMINOPHEN 5-325 MG PO TABS
2.0000 | ORAL_TABLET | Freq: Once | ORAL | Status: AC
  Administered 2013-05-04: 2 via ORAL
  Filled 2013-05-04: qty 2

## 2013-05-04 MED ORDER — ONDANSETRON 4 MG PO TBDP
4.0000 mg | ORAL_TABLET | Freq: Once | ORAL | Status: AC
  Administered 2013-05-04: 4 mg via ORAL
  Filled 2013-05-04: qty 1

## 2013-05-04 MED ORDER — PROMETHAZINE HCL 25 MG/ML IJ SOLN
25.0000 mg | Freq: Once | INTRAMUSCULAR | Status: DC
  Filled 2013-05-04: qty 1

## 2013-05-04 NOTE — ED Notes (Signed)
Pt. reports progressing left knee pain / mild swelling onset last week , denies  Injury or fall , ambulatory . No fever or chills.

## 2013-05-04 NOTE — ED Notes (Signed)
Patient transported to X-ray 

## 2013-05-04 NOTE — ED Provider Notes (Signed)
CSN: [id number]     Arrival date & time 05/04/13  1928 History  This chart was scribed for non-physician practitioner, Wynetta Emery, PA-C working with Ethelda Chick, MD by Greggory Stallion, ED scribe. This patient was seen in room TR09C/TR09C and the patient's care was started at 9:09 PM.   Chief Complaint  Patient presents with  . Knee Pain   The history is provided by the patient. No language interpreter was used.   HPI Comments: Debra Herrera is a 32 y.o. female who presents to the Emergency Department complaining of gradual onset, worsening left knee pain with associated swelling that started 3-4 days ago. She rates her pain 7/10. Denies injury. Bending her knee and bearing weight worsen the pain. Pt has taken Advil with no relief. She denies fever.   Past Medical History  Diagnosis Date  . Headache(784.0)   . Urinary tract infection   . Human papilloma virus   . TMJ arthralgia   . Anxiety   . Depression   . OCD (obsessive compulsive disorder)    Past Surgical History  Procedure Laterality Date  . Tonsillectomy and adenoidectomy    . Adenoidectomy     Family History  Problem Relation Age of Onset  . Other Neg Hx   . Hypertension Mother   . Diabetes Father   . Hypertension Father    History  Substance Use Topics  . Smoking status: Never Smoker   . Smokeless tobacco: Never Used  . Alcohol Use: No   OB History   Grav Para Term Preterm Abortions TAB SAB Ect Mult Living   2 2 2  0 0 0 0 0 0 2     Review of Systems A complete 10 system review of systems was obtained and all systems are negative except as noted in the HPI and PMH.   Allergies  Benadryl and Sulfa antibiotics  Home Medications   Current Outpatient Rx  Name  Route  Sig  Dispense  Refill  . escitalopram (LEXAPRO) 20 MG tablet   Oral   Take 20 mg by mouth daily.         Marland Kitchen ibuprofen (ADVIL,MOTRIN) 200 MG tablet   Oral   Take 400 mg by mouth every 6 (six) hours as needed.          BP  118/73  Pulse 90  Temp(Src) 97.9 F (36.6 C)  Resp 16  Ht 5\' 4"  (1.626 m)  Wt 194 lb (87.998 kg)  BMI 33.28 kg/m2  SpO2 97%  LMP 04/26/2013  Physical Exam  Nursing note and vitals reviewed. Constitutional: She is oriented to person, place, and time. She appears well-developed and well-nourished. No distress.  HENT:  Head: Normocephalic.  Eyes: Conjunctivae and EOM are normal.  Cardiovascular: Normal rate.   Pulmonary/Chest: Effort normal. No stridor.  Musculoskeletal: Normal range of motion.  Left knee effusion, trace erythema. Patient is exquisitely tender to palpation diffusely in the anterior. Refuses to bend the knee.  Neurological: She is alert and oriented to person, place, and time.  Psychiatric: She has a normal mood and affect.    ED Course  Procedures (including critical care time)  DIAGNOSTIC STUDIES: Oxygen Saturation is 97% on RA, normal by my interpretation.    COORDINATION OF CARE: 9:10 PM-Discussed treatment plan which includes knee arthrocentesis with pt at bedside and pt agreed to plan.   Knee Arthrocentesis Procedure Note Date:04/05/2011  At 9:47 PM Indication: Effusion Procedure performed by: Wynetta Emery, PA-C Site: left  knee Technique: high lateral approach Sterile procedures observed Time-out performed prior to start of procedure and correct patient, site and procedure verified.  Anesthetic used (type and amount): 2.5 mL 2% lidocaine with epi Fluid aspirated (amount): 30ml Appearance of Fluid: yellow, clear  Patient's knee was placed into an appropriate position prior to introduction of needle. Patient tolerated procedure well with no complications. Blood loss was minimal.   Labs Review Labs Reviewed  SYNOVIAL CELL COUNT + DIFF, W/ CRYSTALS - Abnormal; Notable for the following:    Appearance-Synovial HAZY (*)    WBC, Synovial 453 (*)    Monocyte-Macrophage-Synovial Fluid 92 (*)    All other components within normal limits  BODY FLUID  CULTURE  GRAM STAIN  PATHOLOGIST SMEAR REVIEW   Imaging Review No results found.  EKG Interpretation   None       MDM   1. Knee effusion, right      Filed Vitals:   05/04/13 1932 05/04/13 2252  BP: 118/73 118/72  Pulse: 90 78  Temp: 97.9 F (36.6 C)   Resp: 16 16  Height: 5\' 4"  (1.626 m)   Weight: 194 lb (87.998 kg)   SpO2: 97% 99%     Debra Herrera is a 32 y.o. female spontaneous right knee effusion. Because the patient will not move the knee I think it is important to rule out a septic arthritis. 30 cc of clear yellow fluid removed. No leukocytosis or bacteria seen on Gram stain. Patient will be given symptomatic relief with pain control and crutches.  Medications  oxyCODONE-acetaminophen (PERCOCET/ROXICET) 5-325 MG per tablet 2 tablet (2 tablets Oral Given 05/04/13 2117)  ondansetron (ZOFRAN-ODT) disintegrating tablet 4 mg (4 mg Oral Given 05/04/13 2204)    Pt is hemodynamically stable, appropriate for, and amenable to discharge at this time. Pt verbalized understanding and agrees with care plan. All questions answered. Outpatient follow-up and specific return precautions discussed.    I personally performed the services described in this documentation, which was scribed in my presence. The recorded information has been reviewed and is accurate.  Note: Portions of this report may have been transcribed using voice recognition software. Every effort was made to ensure accuracy; however, inadvertent computerized transcription errors may be present    Wynetta Emery, PA-C 05/09/13 [phone]

## 2013-05-04 NOTE — ED Notes (Signed)
Pt given Saltines and ginger ale.

## 2013-05-05 LAB — PATHOLOGIST SMEAR REVIEW

## 2013-05-08 LAB — BODY FLUID CULTURE: Culture: NO GROWTH

## 2013-05-13 NOTE — ED Provider Notes (Signed)
Medical screening examination/treatment/procedure(s) were performed by non-physician practitioner and as supervising physician I was immediately available for consultation/collaboration.  EKG Interpretation   None        Martha K Linker, MD 05/13/13 1507 

## 2013-06-26 ENCOUNTER — Emergency Department (HOSPITAL_COMMUNITY)
Admission: EM | Admit: 2013-06-26 | Discharge: 2013-06-27 | Disposition: A | Discharge status: Home / Self Care | Payer: Medicaid Other | Attending: Emergency Medicine | Admitting: Emergency Medicine

## 2013-06-26 ENCOUNTER — Encounter (HOSPITAL_COMMUNITY): Payer: Self-pay | Admitting: Emergency Medicine

## 2013-06-26 DIAGNOSIS — Z79899 Other long term (current) drug therapy: Secondary | ICD-10-CM | POA: Insufficient documentation

## 2013-06-26 DIAGNOSIS — F329 Major depressive disorder, single episode, unspecified: Secondary | ICD-10-CM | POA: Insufficient documentation

## 2013-06-26 DIAGNOSIS — R42 Dizziness and giddiness: Secondary | ICD-10-CM | POA: Insufficient documentation

## 2013-06-26 DIAGNOSIS — F3289 Other specified depressive episodes: Secondary | ICD-10-CM | POA: Insufficient documentation

## 2013-06-26 DIAGNOSIS — Z8669 Personal history of other diseases of the nervous system and sense organs: Secondary | ICD-10-CM | POA: Insufficient documentation

## 2013-06-26 DIAGNOSIS — M545 Low back pain, unspecified: Secondary | ICD-10-CM | POA: Insufficient documentation

## 2013-06-26 DIAGNOSIS — R5383 Other fatigue: Principal | ICD-10-CM

## 2013-06-26 DIAGNOSIS — Z3202 Encounter for pregnancy test, result negative: Secondary | ICD-10-CM | POA: Insufficient documentation

## 2013-06-26 DIAGNOSIS — Z87440 Personal history of urinary (tract) infections: Secondary | ICD-10-CM | POA: Insufficient documentation

## 2013-06-26 DIAGNOSIS — R5381 Other malaise: Secondary | ICD-10-CM | POA: Insufficient documentation

## 2013-06-26 DIAGNOSIS — Z888 Allergy status to other drugs, medicaments and biological substances status: Secondary | ICD-10-CM | POA: Insufficient documentation

## 2013-06-26 DIAGNOSIS — H547 Unspecified visual loss: Secondary | ICD-10-CM | POA: Insufficient documentation

## 2013-06-26 DIAGNOSIS — F429 Obsessive-compulsive disorder, unspecified: Secondary | ICD-10-CM | POA: Insufficient documentation

## 2013-06-26 DIAGNOSIS — Z882 Allergy status to sulfonamides status: Secondary | ICD-10-CM | POA: Insufficient documentation

## 2013-06-26 DIAGNOSIS — Z8619 Personal history of other infectious and parasitic diseases: Secondary | ICD-10-CM | POA: Insufficient documentation

## 2013-06-26 DIAGNOSIS — F411 Generalized anxiety disorder: Secondary | ICD-10-CM | POA: Insufficient documentation

## 2013-06-26 LAB — CBC WITH DIFFERENTIAL/PLATELET
Basophils Absolute: 0 10*3/uL (ref 0.0–0.1)
Basophils Relative: 0 % (ref 0–1)
EOS ABS: 0.1 10*3/uL (ref 0.0–0.7)
EOS PCT: 1 % (ref 0–5)
HEMATOCRIT: 38 % (ref 36.0–46.0)
HEMOGLOBIN: 12.8 g/dL (ref 12.0–15.0)
LYMPHS ABS: 3.2 10*3/uL (ref 0.7–4.0)
Lymphocytes Relative: 38 % (ref 12–46)
MCH: 29.7 pg (ref 26.0–34.0)
MCHC: 33.7 g/dL (ref 30.0–36.0)
MCV: 88.2 fL (ref 78.0–100.0)
MONO ABS: 0.4 10*3/uL (ref 0.1–1.0)
MONOS PCT: 5 % (ref 3–12)
Neutro Abs: 4.7 10*3/uL (ref 1.7–7.7)
Neutrophils Relative %: 56 % (ref 43–77)
PLATELETS: 295 10*3/uL (ref 150–400)
RBC: 4.31 MIL/uL (ref 3.87–5.11)
RDW: 12.9 % (ref 11.5–15.5)
WBC: 8.3 10*3/uL (ref 4.0–10.5)

## 2013-06-26 LAB — COMPREHENSIVE METABOLIC PANEL
ALT: 15 U/L (ref 0–35)
AST: 17 U/L (ref 0–37)
Albumin: 4 g/dL (ref 3.5–5.2)
Alkaline Phosphatase: 90 U/L (ref 39–117)
BUN: 13 mg/dL (ref 6–23)
CALCIUM: 9.7 mg/dL (ref 8.4–10.5)
CO2: 28 meq/L (ref 19–32)
CREATININE: 0.74 mg/dL (ref 0.50–1.10)
Chloride: 98 mEq/L (ref 96–112)
GLUCOSE: 98 mg/dL (ref 70–99)
Potassium: 4.6 mEq/L (ref 3.7–5.3)
Sodium: 138 mEq/L (ref 137–147)
TOTAL PROTEIN: 7.8 g/dL (ref 6.0–8.3)
Total Bilirubin: <0.2 mg/dL — ABNORMAL LOW (ref 0.3–1.2)

## 2013-06-26 LAB — POCT PREGNANCY, URINE: PREG TEST UR: NEGATIVE

## 2013-06-26 LAB — GLUCOSE, CAPILLARY
Glucose-Capillary: 108 mg/dL — ABNORMAL HIGH (ref 70–99)
Glucose-Capillary: 97 mg/dL (ref 70–99)

## 2013-06-26 LAB — URINALYSIS, ROUTINE W REFLEX MICROSCOPIC
Bilirubin Urine: NEGATIVE
Glucose, UA: NEGATIVE mg/dL
Ketones, ur: NEGATIVE mg/dL
LEUKOCYTES UA: NEGATIVE
NITRITE: NEGATIVE
PH: 6 (ref 5.0–8.0)
Protein, ur: NEGATIVE mg/dL
SPECIFIC GRAVITY, URINE: 1.024 (ref 1.005–1.030)
UROBILINOGEN UA: 0.2 mg/dL (ref 0.0–1.0)

## 2013-06-26 LAB — URINE MICROSCOPIC-ADD ON

## 2013-06-26 NOTE — ED Notes (Signed)
Pt. reports generalized weakness , " shaky" , lightheaded and " seeing spots" for several days . Alert and oriented / respirations unlabored .

## 2013-06-27 NOTE — ED Provider Notes (Signed)
CSN: [id number]     Arrival date & time 06/26/13  1933 History   First MD Initiated Contact with Patient 06/26/13 2332     Chief Complaint  Patient presents with  . Fatigue  . Dizziness   (Consider location/radiation/quality/duration/timing/severity/associated sxs/prior Treatment) Patient is a 33 y.o. female presenting with dizziness. The history is provided by the patient.  Dizziness Matisha Termine is a 33 y.o. female who presents for evaluation of lightheadedness and shaky sensation for several days. She's also had several periods when she "blacks out". These are associated with a sensation of losing her vision, but being able to hear people around her and being able to stay in an upright position. These episodes are very brief, lasting only a few seconds at a time. There is no associated shaking falls, headaches, back pains, or dizziness. She denies chest pain, abdominal pain, or constipation. She has had urinary frequency recently. Last menstrual period began 06/26/2013. There are no other known modifying factors.  Past Medical History  Diagnosis Date  . Headache(784.0)   . Urinary tract infection   . Human papilloma virus   . TMJ arthralgia   . Anxiety   . Depression   . OCD (obsessive compulsive disorder)    Past Surgical History  Procedure Laterality Date  . Tonsillectomy and adenoidectomy    . Adenoidectomy     Family History  Problem Relation Age of Onset  . Other Neg Hx   . Hypertension Mother   . Diabetes Father   . Hypertension Father    History  Substance Use Topics  . Smoking status: Never Smoker   . Smokeless tobacco: Never Used  . Alcohol Use: No   OB History   Grav Para Term Preterm Abortions TAB SAB Ect Mult Living   2 2 2  0 0 0 0 0 0 2     Review of Systems  Neurological: Positive for dizziness.  All other systems reviewed and are negative.    Allergies  Benadryl and Sulfa antibiotics  Home Medications   Current Outpatient Rx   Name  Route  Sig  Dispense  Refill  . ibuprofen (ADVIL,MOTRIN) 200 MG tablet   Oral   Take 400 mg by mouth every 6 (six) hours as needed for moderate pain.          Marland Kitchen sertraline (ZOLOFT) 100 MG tablet   Oral   Take 100 mg by mouth daily.          BP 118/75  Pulse 71  Temp(Src) 98 F (36.7 C) (Oral)  Resp 18  Ht 5\' 4"  (1.626 m)  Wt 198 lb (89.812 kg)  BMI 33.97 kg/m2  SpO2 100%  LMP 06/26/2013 Physical Exam  Nursing note and vitals reviewed. Constitutional: She is oriented to person, place, and time. She appears well-developed and well-nourished.  HENT:  Head: Normocephalic and atraumatic.  Eyes: Conjunctivae and EOM are normal. Pupils are equal, round, and reactive to light.  Neck: Normal range of motion and phonation normal. Neck supple.  Cardiovascular: Normal rate, regular rhythm and intact distal pulses.   Pulmonary/Chest: Effort normal and breath sounds normal. She exhibits no tenderness.  Abdominal: Soft. She exhibits no distension. There is no tenderness. There is no guarding.  Musculoskeletal: Normal range of motion. She exhibits no edema and no tenderness.  Mild left lumbar tenderness  Neurological: She is alert and oriented to person, place, and time. She exhibits normal muscle tone.  Skin: Skin is warm and dry. No  rash noted.  Psychiatric: Her behavior is normal. Judgment and thought content normal.  Anxious    ED Course  Procedures (including critical care time) Medications - No data to display  Patient Vitals for the past 24 hrs:  BP Temp Temp src Pulse Resp SpO2 Height Weight  06/27/13 0136 118/75 mmHg - - - 18 100 % - -  06/27/13 0100 119/73 mmHg - - 71 18 100 % - -  06/27/13 0056 114/86 mmHg - - 85 - - - -  06/27/13 0053 117/78 mmHg - - 78 - - - -  06/27/13 0049 110/72 mmHg - - 76 - - - -  06/27/13 0030 112/79 mmHg - - 82 15 98 % - -  06/27/13 0015 116/72 mmHg - - 75 18 97 % - -  06/27/13 0000 119/76 mmHg - - 76 16 97 % - -  06/26/13 2341  112/71 mmHg 98 F (36.7 C) Oral 75 14 100 % - -  06/26/13 1945 128/84 mmHg 98.4 F (36.9 C) Oral 93 14 100 % 5\' 4"  (1.626 m) 198 lb (89.812 kg)    12:05 AM Reevaluation with update and discussion. After initial assessment and treatment, an updated evaluation reveals no further complaints. Ellijah Leffel L     Labs Review Labs Reviewed  COMPREHENSIVE METABOLIC PANEL - Abnormal; Notable for the following:    Total Bilirubin <0.2 (*)    All other components within normal limits  URINALYSIS, ROUTINE W REFLEX MICROSCOPIC - Abnormal; Notable for the following:    APPearance CLOUDY (*)    Hgb urine dipstick SMALL (*)    All other components within normal limits  GLUCOSE, CAPILLARY - Abnormal; Notable for the following:    Glucose-Capillary 108 (*)    All other components within normal limits  URINE MICROSCOPIC-ADD ON - Abnormal; Notable for the following:    Squamous Epithelial / LPF FEW (*)    All other components within normal limits  CBC WITH DIFFERENTIAL  GLUCOSE, CAPILLARY  POCT PREGNANCY, URINE   Imaging Review No results found.  EKG Interpretation    Date/Time:  Saturday June 27 2013 01:00:27 EST Ventricular Rate:  70 PR Interval:  124 QRS Duration: 94 QT Interval:  441 QTC Calculation: 476 R Axis:   69 Text Interpretation:  Sinus rhythm Borderline prolonged QT interval No old tracing to compare Confirmed by [facility] LLCWENTZ  MD, Araminta Zorn ([id number]) on 06/27/2013 1:14:35 AM            MDM   1. Malaise     Vague symptoms, with negative emergency department evaluation. No evidence for volume depletion, ACS, metabolic instability, or suspected impending vascular collapse.  Nursing Notes Reviewed/ Care Coordinated Applicable Imaging Reviewed Interpretation of Laboratory Data incorporated into ED treatment  The patient appears reasonably screened and/or stabilized for discharge and I doubt any other medical condition or other [facility] requiring further screening, evaluation, or  treatment in the ED at this time prior to discharge.  Plan: Home Medications- usual; Home Treatments- rest; return here if the recommended treatment, does not improve the symptoms; Recommended follow up- PCP, for check up in 1 week if needed  Flint MelterElliott L Kasie Leccese, MD 06/27/13 0710

## 2013-06-27 NOTE — Discharge Instructions (Signed)
°  Use our resource guide, to find a primary care doctor. Get plenty of rest, and drink a lot of fluids.     Weakness Weakness is a lack of strength. It may be felt all over the body (generalized) or in one specific part of the body (focal). Some causes of weakness can be serious. You may need further medical evaluation, especially if you are elderly or you have a history of immunosuppression (such as chemotherapy or HIV), kidney disease, heart disease, or diabetes. CAUSES  Weakness can be caused by many different things, including:  Infection.  Physical exhaustion.  Internal bleeding or other blood loss that results in a lack of red blood cells (anemia).  Dehydration. This cause is more common in elderly people.  Side effects or electrolyte abnormalities from medicines, such as pain medicines or sedatives.  Emotional distress, anxiety, or depression.  Circulation problems, especially severe peripheral arterial disease.  Heart disease, such as rapid atrial fibrillation, bradycardia, or heart failure.  Nervous system disorders, such as Guillain-Barr syndrome, multiple sclerosis, or stroke. DIAGNOSIS  To find the cause of your weakness, your caregiver will take your history and perform a physical exam. Lab tests or X-rays may also be ordered, if needed. TREATMENT  Treatment of weakness depends on the cause of your symptoms and can vary greatly. HOME CARE INSTRUCTIONS   Rest as needed.  Eat a well-balanced diet.  Try to get some exercise every day.  Only take over-the-counter or prescription medicines as directed by your caregiver. SEEK MEDICAL CARE IF:   Your weakness seems to be getting worse or spreads to other parts of your body.  You develop new aches or pains. SEEK IMMEDIATE MEDICAL CARE IF:   You cannot perform your normal daily activities, such as getting dressed and feeding yourself.  You cannot walk up and down stairs, or you feel exhausted when you do  so.  You have shortness of breath or chest pain.  You have difficulty moving parts of your body.  You have weakness in only one area of the body or on only one side of the body.  You have a fever.  You have trouble speaking or swallowing.  You cannot control your bladder or bowel movements.  You have black or bloody vomit or stools. MAKE SURE YOU:  Understand these instructions.  Will watch your condition.  Will get help right away if you are not doing well or get worse. Document Released: 06/04/2005 Document Revised: 12/04/2011 Document Reviewed: 08/03/2011 [facility] SurgeryExitCare Patient Information 2014 [place]ExitCare, MarylandLLC.

## 2014-01-24 ENCOUNTER — Emergency Department (HOSPITAL_COMMUNITY)
Admission: EM | Admit: 2014-01-24 | Discharge: 2014-01-24 | Disposition: A | Discharge status: Home / Self Care | Payer: Medicaid Other | Attending: Emergency Medicine | Admitting: Emergency Medicine

## 2014-01-24 ENCOUNTER — Encounter (HOSPITAL_COMMUNITY): Payer: Self-pay | Admitting: Emergency Medicine

## 2014-01-24 DIAGNOSIS — F411 Generalized anxiety disorder: Secondary | ICD-10-CM | POA: Insufficient documentation

## 2014-01-24 DIAGNOSIS — H5710 Ocular pain, unspecified eye: Secondary | ICD-10-CM | POA: Insufficient documentation

## 2014-01-24 DIAGNOSIS — Z79899 Other long term (current) drug therapy: Secondary | ICD-10-CM | POA: Insufficient documentation

## 2014-01-24 DIAGNOSIS — M26629 Arthralgia of temporomandibular joint, unspecified side: Secondary | ICD-10-CM | POA: Insufficient documentation

## 2014-01-24 DIAGNOSIS — Z87440 Personal history of urinary (tract) infections: Secondary | ICD-10-CM | POA: Insufficient documentation

## 2014-01-24 DIAGNOSIS — F429 Obsessive-compulsive disorder, unspecified: Secondary | ICD-10-CM | POA: Insufficient documentation

## 2014-01-24 DIAGNOSIS — F3289 Other specified depressive episodes: Secondary | ICD-10-CM | POA: Insufficient documentation

## 2014-01-24 DIAGNOSIS — H5789 Other specified disorders of eye and adnexa: Secondary | ICD-10-CM

## 2014-01-24 DIAGNOSIS — H579 Unspecified disorder of eye and adnexa: Secondary | ICD-10-CM | POA: Insufficient documentation

## 2014-01-24 DIAGNOSIS — Z8619 Personal history of other infectious and parasitic diseases: Secondary | ICD-10-CM | POA: Insufficient documentation

## 2014-01-24 DIAGNOSIS — F329 Major depressive disorder, single episode, unspecified: Secondary | ICD-10-CM | POA: Insufficient documentation

## 2014-01-24 MED ORDER — ERYTHROMYCIN 5 MG/GM OP OINT: TOPICAL_OINTMENT | OPHTHALMIC | Status: DC

## 2014-01-24 MED ORDER — FLUORESCEIN SODIUM 1 MG OP STRP
1.0000 | ORAL_STRIP | Freq: Once | OPHTHALMIC | Status: AC
  Administered 2014-01-24: 1 via OPHTHALMIC
  Filled 2014-01-24: qty 1

## 2014-01-24 MED ORDER — TETRACAINE HCL 0.5 % OP SOLN
1.0000 [drp] | Freq: Once | OPHTHALMIC | Status: AC
  Administered 2014-01-24: 1 [drp] via OPHTHALMIC
  Filled 2014-01-24: qty 2

## 2014-01-24 NOTE — Discharge Instructions (Signed)

## 2014-01-24 NOTE — ED Notes (Signed)
Pt sts she has been having left eye pain for the past 2 days.  Sts she feels like there is something in her eye.  Nothing noted during assessment.

## 2014-01-24 NOTE — ED Notes (Signed)
Morgan lens placed on pt.

## 2014-01-24 NOTE — ED Notes (Signed)
Woods lamp, tetracaine, and fluorescein strip at bedside.  Pt made aware of plan of care.

## 2014-01-24 NOTE — ED Notes (Signed)
Pt stating that morgans lens is bothering her eye.  Morgans lens removed per request.  PA made aware.

## 2014-01-24 NOTE — ED Provider Notes (Signed)
CSN: [id number]     Arrival date & time 01/24/14  2057 History  This chart was scribed for non-physician practitioner working with Toy CookeyMegan Docherty, MD by Elveria Risingimelie Horne, ED Scribe. This patient was seen in room TR04C/TR04C and the patient's care was started at 9:41 PM.   Chief Complaint  Patient presents with  . Eye Pain     The history is provided by the patient. No language interpreter was used.   HPI Comments: Debra Herrera is a 33 y.o. female who presents to the Emergency Department with chief complaint of left eye pain for two days with sensation of foreign body. Patient states that it feels like something is embedded in her eye and reports visualizing brown/gray spot on the white of her eye. Patient states that her husband has also visualized a foreign body in her eye. Patient reports treatment eye drops, without relief. She denies any fevers, chills, nausea, vomiting. Denies any changes in vision. Patient wears glasses.   Past Medical History  Diagnosis Date  . Headache(784.0)   . Urinary tract infection   . Human papilloma virus   . TMJ arthralgia   . Anxiety   . Depression   . OCD (obsessive compulsive disorder)    Past Surgical History  Procedure Laterality Date  . Tonsillectomy and adenoidectomy    . Adenoidectomy     Family History  Problem Relation Age of Onset  . Other Neg Hx   . Hypertension Mother   . Diabetes Father   . Hypertension Father    History  Substance Use Topics  . Smoking status: Never Smoker   . Smokeless tobacco: Never Used  . Alcohol Use: No   OB History   Grav Para Term Preterm Abortions TAB SAB Ect Mult Living   2 2 2  0 0 0 0 0 0 2     Review of Systems  Constitutional: Negative for fever and chills.  Eyes: Positive for pain, discharge and redness. Negative for photophobia and visual disturbance.      Allergies  Benadryl and Sulfa antibiotics  Home Medications   Prior to Admission medications   Medication Sig Start  Date End Date Taking? Authorizing Provider  ibuprofen (ADVIL,MOTRIN) 200 MG tablet Take 400 mg by mouth every 6 (six) hours as needed for moderate pain.     Historical Provider, MD  sertraline (ZOLOFT) 100 MG tablet Take 100 mg by mouth daily.    Historical Provider, MD   Triage Vitals: BP 113/100  Pulse 93  Temp(Src) 98.5 F (36.9 C) (Oral)  Resp 18  SpO2 99%  LMP 01/16/2014 Physical Exam  Nursing note and vitals reviewed. Constitutional: She is oriented to person, place, and time. She appears well-developed and well-nourished.  HENT:  Head: Normocephalic and atraumatic.  Eyes: EOM are normal.  Fundoscopic exam limited secondary to no eye dilation, no foreign body is seen on exam. Eye lid everted no evidence of fluorescence uptake or corneal abrasion. No dendritic lesions. Slit lamp exam is unremarkable for acute findings.  (patient wears glasses)  Visual Acuity - Bilateral Distance: 20/30 ; R Distance: 20/20 ; L Distance: 20/50  Neck: Neck supple.  Cardiovascular: Normal rate.   Pulmonary/Chest: Effort normal.  Musculoskeletal: Normal range of motion.  Neurological: She is alert and oriented to person, place, and time.  Skin: Skin is warm and dry.  Psychiatric: She has a normal mood and affect. Her behavior is normal.    ED Course  Procedures (including critical care time)  COORDINATION OF CARE: 9:41 PM- Discussed treatment plan with patient at bedside and patient agreed to plan.   Labs Review Labs Reviewed - No data to display  Imaging Review No results found.   EKG Interpretation None      MDM   Final diagnoses:  Eye irritation    Patient with complaint of foreign body in eye. She complains of seeing a brownish/grayish foreign object. This was easily removed with a Q-tip, however the patient still sense foreign body sensation. There is no evidence of corneal abrasion or laceration. No other foreign body was noted on exam. Her eyelid was everted. Additionally, we  irrigated the eye with a Morgan lens. I will discharge the patient to home with erythromycin ointment, and recommend ophthalmology followup. Patient understands and agrees with the plan. She is stable and ready for discharge.  I personally performed the services described in this documentation, which was scribed in my presence. The recorded information has been reviewed and is accurate.    Roxy Horseman, PA-C 01/25/14 [phone]

## 2014-01-26 NOTE — ED Provider Notes (Signed)
Medical screening examination/treatment/procedure(s) were performed by non-physician practitioner and as supervising physician I was immediately available for consultation/collaboration.  Toy CookeyMegan Docherty, MD 01/26/14 1230

## 2014-04-19 ENCOUNTER — Encounter (HOSPITAL_COMMUNITY): Payer: Self-pay | Admitting: Emergency Medicine

## 2014-06-11 ENCOUNTER — Emergency Department (HOSPITAL_COMMUNITY)
Admission: EM | Admit: 2014-06-11 | Discharge: 2014-06-11 | Disposition: A | Discharge status: Home / Self Care | Payer: Medicaid Other | Attending: Emergency Medicine | Admitting: Emergency Medicine

## 2014-06-11 ENCOUNTER — Encounter (HOSPITAL_COMMUNITY): Payer: Self-pay | Admitting: *Deleted

## 2014-06-11 ENCOUNTER — Emergency Department (HOSPITAL_COMMUNITY): Payer: Medicaid Other

## 2014-06-11 DIAGNOSIS — Z8739 Personal history of other diseases of the musculoskeletal system and connective tissue: Secondary | ICD-10-CM | POA: Insufficient documentation

## 2014-06-11 DIAGNOSIS — J159 Unspecified bacterial pneumonia: Secondary | ICD-10-CM | POA: Insufficient documentation

## 2014-06-11 DIAGNOSIS — F329 Major depressive disorder, single episode, unspecified: Secondary | ICD-10-CM | POA: Insufficient documentation

## 2014-06-11 DIAGNOSIS — F42 Obsessive-compulsive disorder: Secondary | ICD-10-CM | POA: Insufficient documentation

## 2014-06-11 DIAGNOSIS — R05 Cough: Secondary | ICD-10-CM

## 2014-06-11 DIAGNOSIS — R059 Cough, unspecified: Secondary | ICD-10-CM

## 2014-06-11 DIAGNOSIS — Z792 Long term (current) use of antibiotics: Secondary | ICD-10-CM | POA: Insufficient documentation

## 2014-06-11 DIAGNOSIS — F419 Anxiety disorder, unspecified: Secondary | ICD-10-CM | POA: Insufficient documentation

## 2014-06-11 DIAGNOSIS — J189 Pneumonia, unspecified organism: Secondary | ICD-10-CM

## 2014-06-11 DIAGNOSIS — Z79899 Other long term (current) drug therapy: Secondary | ICD-10-CM | POA: Insufficient documentation

## 2014-06-11 DIAGNOSIS — Z87440 Personal history of urinary (tract) infections: Secondary | ICD-10-CM | POA: Insufficient documentation

## 2014-06-11 DIAGNOSIS — Z8619 Personal history of other infectious and parasitic diseases: Secondary | ICD-10-CM | POA: Insufficient documentation

## 2014-06-11 MED ORDER — ACETAMINOPHEN 500 MG PO TABS
1000.0000 mg | ORAL_TABLET | Freq: Once | ORAL | Status: AC
  Administered 2014-06-11: 1000 mg via ORAL
  Filled 2014-06-11: qty 2

## 2014-06-11 MED ORDER — AZITHROMYCIN 250 MG PO TABS
500.0000 mg | ORAL_TABLET | Freq: Once | ORAL | Status: AC
  Administered 2014-06-11: 500 mg via ORAL
  Filled 2014-06-11: qty 2

## 2014-06-11 MED ORDER — PROMETHAZINE-CODEINE 6.25-10 MG/5ML PO SYRP: 5.0000 mL | ORAL_SOLUTION | Freq: Four times a day (QID) | ORAL | Status: DC | PRN

## 2014-06-11 MED ORDER — AZITHROMYCIN 250 MG PO TABS: 250.0000 mg | ORAL_TABLET | Freq: Every day | ORAL | Status: DC

## 2014-06-11 NOTE — Discharge Instructions (Signed)
Take antibiotic beginning tomorrow as you were given the first dose in the emergency department today. Take promethazine codeine for severe cough. No driving or operating heavy machinery while taking promethazine codeine as it may cause drowsiness.  Pneumonia Pneumonia is an infection of the lungs.  CAUSES Pneumonia may be caused by bacteria or a virus. Usually, these infections are caused by breathing infectious particles into the lungs (respiratory tract). SIGNS AND SYMPTOMS   Cough.  Fever.  Chest pain.  Increased rate of breathing.  Wheezing.  Mucus production. DIAGNOSIS  If you have the common symptoms of pneumonia, your health care provider will typically confirm the diagnosis with a chest X-ray. The X-ray will show an abnormality in the lung (pulmonary infiltrate) if you have pneumonia. Other tests of your blood, urine, or sputum may be done to find the specific cause of your pneumonia. Your health care provider may also do tests (blood gases or pulse oximetry) to see how well your lungs are working. TREATMENT  Some forms of pneumonia may be spread to other people when you cough or sneeze. You may be asked to wear a mask before and during your exam. Pneumonia that is caused by bacteria is treated with antibiotic medicine. Pneumonia that is caused by the influenza virus may be treated with an antiviral medicine. Most other viral infections must run their course. These infections will not respond to antibiotics.  HOME CARE INSTRUCTIONS   Cough suppressants may be used if you are losing too much rest. However, coughing protects you by clearing your lungs. You should avoid using cough suppressants if you can.  Your health care provider may have prescribed medicine if he or she thinks your pneumonia is caused by bacteria or influenza. Finish your medicine even if you start to feel better.  Your health care provider may also prescribe an expectorant. This loosens the mucus to be coughed  up.  Take medicines only as directed by your health care provider.  Do not smoke. Smoking is a common cause of bronchitis and can contribute to pneumonia. If you are a smoker and continue to smoke, your cough may last several weeks after your pneumonia has cleared.  A cold steam vaporizer or humidifier in your room or home may help loosen mucus.  Coughing is often worse at night. Sleeping in a semi-upright position in a recliner or using a couple pillows under your head will help with this.  Get rest as you feel it is needed. Your body will usually let you know when you need to rest. PREVENTION A pneumococcal shot (vaccine) is available to prevent a common bacterial cause of pneumonia. This is usually suggested for:  People over 31 years old.  Patients on chemotherapy.  People with chronic lung problems, such as bronchitis or emphysema.  People with immune system problems. If you are over 65 or have a high risk condition, you may receive the pneumococcal vaccine if you have not received it before. In some countries, a routine influenza vaccine is also recommended. This vaccine can help prevent some cases of pneumonia.You may be offered the influenza vaccine as part of your care. If you smoke, it is time to quit. You may receive instructions on how to stop smoking. Your health care provider can provide medicines and counseling to help you quit. SEEK MEDICAL CARE IF: You have a fever. SEEK IMMEDIATE MEDICAL CARE IF:   Your illness becomes worse. This is especially true if you are elderly or weakened from any  other disease.  You cannot control your cough with suppressants and are losing sleep.  You begin coughing up blood.  You develop pain which is getting worse or is uncontrolled with medicines.  Any of the symptoms which initially brought you in for treatment are getting worse rather than better.  You develop shortness of breath or chest pain. MAKE SURE YOU:   Understand  these instructions.  Will watch your condition.  Will get help right away if you are not doing well or get worse. Document Released: 06/04/2005 Document Revised: 10/19/2013 Document Reviewed: 08/24/2010 [facility]Exit[facility] Patient Information 2015 [place]inesExitCare, [place]LLC. This information is not intended to replace advice given to you by your health care provider. Make sure you discuss any questions you have with your health care provider.  Cough, Adult  A cough is a reflex that helps clear your throat and airways. It can help heal the body or may be a reaction to an irritated airway. A cough may only last 2 or 3 weeks (acute) or may last more than 8 weeks (chronic).  CAUSES Acute cough:  Viral or bacterial infections. Chronic cough:  Infections.  Allergies.  Asthma.  Post-nasal drip.  Smoking.  Heartburn or acid reflux.  Some medicines.  Chronic lung problems (COPD).  Cancer. SYMPTOMS   Cough.  Fever.  Chest pain.  Increased breathing rate.  High-pitched whistling sound when breathing (wheezing).  Colored mucus that you cough up (sputum). TREATMENT   A bacterial cough may be treated with antibiotic medicine.  A viral cough must run its course and will not respond to antibiotics.  Your caregiver may recommend other treatments if you have a chronic cough. HOME CARE INSTRUCTIONS   Only take over-the-counter or prescription medicines for pain, discomfort, or fever as directed by your caregiver. Use cough suppressants only as directed by your caregiver.  Use a cold steam vaporizer or humidifier in your bedroom or home to help loosen secretions.  Sleep in a semi-upright position if your cough is worse at night.  Rest as needed.  Stop smoking if you smoke. SEEK IMMEDIATE MEDICAL CARE IF:   You have pus in your sputum.  Your cough starts to worsen.  You cannot control your cough with suppressants and are losing sleep.  You begin coughing up blood.  You have difficulty  breathing.  You develop pain which is getting worse or is uncontrolled with medicine.  You have a fever. MAKE SURE YOU:   Understand these instructions.  Will watch your condition.  Will get help right away if you are not doing well or get worse. Document Released: 12/01/2010 Document Revised: 08/27/2011 Document Reviewed: 12/01/2010 [facility] Patient Information 2015 [place]ExitCare, [place]LLC. This information is not intended to replace advice given to you by your health care provider. Make sure you discuss any questions you have with your health care provider.

## 2014-06-11 NOTE — ED Provider Notes (Signed)
CSN: [id number]     Arrival date & time 06/11/14  0503 History   First MD Initiated Contact with Patient 06/11/14 [phone]     Chief Complaint  Patient presents with  . Cough     (Consider location/radiation/quality/duration/timing/severity/associated sxs/prior Treatment) HPI Comments: 33 year old female presenting with cough and congestion 1 week, worsening over the past few days. Patient reports cough is productive with green phlegm, she had one episode of coughing up blood tinged sputum. She states she is coughing so much that she is getting a headache. Admits to chest congestion and mild shortness of breath. She has tried multiple over-the-counter medications with no relief. No fevers. She has chills..ed10   Patient is a 33 y.o. female presenting with cough. The history is provided by the patient.  Cough   Past Medical History  Diagnosis Date  . Headache(784.0)   . Urinary tract infection   . Human papilloma virus   . TMJ arthralgia   . Anxiety   . Depression   . OCD (obsessive compulsive disorder)    Past Surgical History  Procedure Laterality Date  . Tonsillectomy and adenoidectomy    . Adenoidectomy     Family History  Problem Relation Age of Onset  . Other Neg Hx   . Hypertension Mother   . Diabetes Father   . Hypertension Father    History  Substance Use Topics  . Smoking status: Never Smoker   . Smokeless tobacco: Never Used  . Alcohol Use: No   OB History    Gravida Para Term Preterm AB TAB SAB Ectopic Multiple Living   2 2 2  0 0 0 0 0 0 2     Review of Systems  10 Systems reviewed and are negative for acute change except as noted in the HPI.  Allergies  Benadryl and Sulfa antibiotics  Home Medications   Prior to Admission medications   Medication Sig Start Date End Date Taking? Authorizing Provider  escitalopram (LEXAPRO) 20 MG tablet Take 20 mg by mouth daily.   Yes Historical Provider, MD  guaiFENesin-dextromethorphan (ROBITUSSIN DM) 100-10  MG/5ML syrup Take 15 mLs by mouth every 4 (four) hours as needed for cough.   Yes Historical Provider, MD  Pseudoeph-Doxylamine-DM-APAP (DAYQUIL/NYQUIL COLD/FLU RELIEF PO) Take 2 capsules by mouth as needed (for cold).   Yes Historical Provider, MD  Pseudoeph-Doxylamine-DM-APAP (TYLENOL COLD/FLU SEVERE NIGHT PO) Take 2 capsules by mouth as needed (for cold).   Yes Historical Provider, MD  azithromycin (ZITHROMAX) 250 MG tablet Take 1 tablet (250 mg total) by mouth daily. Take1 every day until finished. 06/11/14   Kathrynn Speedobyn M Paulino Cork, PA-C  erythromycin ophthalmic ointment Place a 1/2 inch ribbon of ointment into the lower eyelid. 01/24/14   Roxy Horsemanobert Browning, PA-C  promethazine-codeine (PHENERGAN WITH CODEINE) 6.25-10 MG/5ML syrup Take 5 mLs by mouth every 6 (six) hours as needed for cough. 06/11/14   Kaheem Halleck M Eddie Koc, PA-C   BP 109/62 mmHg  Pulse 97  Temp(Src) 98.5 F (36.9 C) (Oral)  Resp 22  Ht 5\' 4"  (1.626 m)  Wt 194 lb (87.998 kg)  BMI 33.28 kg/m2  SpO2 96% Physical Exam  Constitutional: She is oriented to person, place, and time. She appears well-developed and well-nourished. No distress.  HENT:  Head: Normocephalic and atraumatic.  Post oropharyngeal erythema without edema or exudate.  Eyes: Conjunctivae and EOM are normal.  Neck: Normal range of motion. Neck supple.  Cardiovascular: Normal rate, regular rhythm and normal heart sounds.   Pulmonary/Chest: Effort  normal. No respiratory distress.  Diffuse rhonchi bilateral.  Musculoskeletal: Normal range of motion. She exhibits no edema.  Neurological: She is alert and oriented to person, place, and time. No sensory deficit.  Skin: Skin is warm and dry.  Psychiatric: She has a normal mood and affect. Her behavior is normal.  Nursing note and vitals reviewed.   ED Course  Procedures (including critical care time) Labs Review Labs Reviewed - No data to display  Imaging Review Dg Chest 2 View  06/11/2014   CLINICAL DATA:  Acute onset of  cough, congestion and bilateral chest pain for 4 days. Initial encounter.  EXAM: CHEST  2 VIEW  COMPARISON:  Chest radiograph performed 03/18/2014  FINDINGS: The lungs are well-aerated. Mild bibasilar hazy opacities may reflect a mild infectious process, or possibly minimal interstitial edema. Mild vascular congestion is noted. There is no evidence of pleural effusion or pneumothorax.  The heart is normal in size; the mediastinal contour is within normal limits. No acute osseous abnormalities are seen.  IMPRESSION: Mild bibasilar hazy opacities may reflect a mild infectious process, or possibly minimal interstitial edema. Mild vascular congestion noted.   Electronically Signed   By: Roanna RaiderJeffery  Chang M.D.   On: 06/11/2014 06:18     EKG Interpretation None      MDM   Final diagnoses:  Cough  CAP (community acquired pneumonia)    patient in no apparent distress. Noted tachycardia on arrival, however on my examination no tachycardia. O2 sats remained between  94-97% on room air. Diffuse rhonchi bilateral. Chest x-ray showed mild bibasilar hazy opacities reflecting a mild infectious process or interstitial edema. Given patient's symptoms of recent URI symptoms along with productive cough with green phlegm, most likely infectious. Treat with azithromycin , promethazine codeine for cough. She does not meet inpatient criteria for community-acquired pneumonia. Treated as outpatient. Stable for discharge. Return precautions given. Patient states understanding of treatment care plan and is agreeable.  Kathrynn SpeedRobyn M Breane Grunwald, PA-C 06/11/14 [phone]  Loren Raceravid Yelverton, MD 06/12/14 [id number]

## 2014-06-11 NOTE — ED Notes (Signed)
Pt c/o constant cough with tight chest feeling. Pt states that she has coughed so much that it is making her head hurt.

## 2015-10-05 ENCOUNTER — Emergency Department (HOSPITAL_COMMUNITY)
Admission: EM | Admit: 2015-10-05 | Discharge: 2015-10-05 | Disposition: A | Discharge status: Home / Self Care | Payer: Self-pay | Attending: Emergency Medicine | Admitting: Emergency Medicine

## 2015-10-05 ENCOUNTER — Encounter (HOSPITAL_COMMUNITY): Payer: Self-pay | Admitting: *Deleted

## 2015-10-05 ENCOUNTER — Emergency Department (HOSPITAL_COMMUNITY): Payer: Self-pay

## 2015-10-05 DIAGNOSIS — S199XXA Unspecified injury of neck, initial encounter: Secondary | ICD-10-CM | POA: Insufficient documentation

## 2015-10-05 DIAGNOSIS — W228XXA Striking against or struck by other objects, initial encounter: Secondary | ICD-10-CM | POA: Insufficient documentation

## 2015-10-05 DIAGNOSIS — Y998 Other external cause status: Secondary | ICD-10-CM | POA: Insufficient documentation

## 2015-10-05 DIAGNOSIS — S0003XA Contusion of scalp, initial encounter: Secondary | ICD-10-CM | POA: Insufficient documentation

## 2015-10-05 DIAGNOSIS — Y9389 Activity, other specified: Secondary | ICD-10-CM | POA: Insufficient documentation

## 2015-10-05 DIAGNOSIS — Z8619 Personal history of other infectious and parasitic diseases: Secondary | ICD-10-CM | POA: Insufficient documentation

## 2015-10-05 DIAGNOSIS — Y9289 Other specified places as the place of occurrence of the external cause: Secondary | ICD-10-CM | POA: Insufficient documentation

## 2015-10-05 DIAGNOSIS — F329 Major depressive disorder, single episode, unspecified: Secondary | ICD-10-CM | POA: Insufficient documentation

## 2015-10-05 DIAGNOSIS — Z79899 Other long term (current) drug therapy: Secondary | ICD-10-CM | POA: Insufficient documentation

## 2015-10-05 DIAGNOSIS — F429 Obsessive-compulsive disorder, unspecified: Secondary | ICD-10-CM | POA: Insufficient documentation

## 2015-10-05 DIAGNOSIS — Z792 Long term (current) use of antibiotics: Secondary | ICD-10-CM | POA: Insufficient documentation

## 2015-10-05 DIAGNOSIS — Z87440 Personal history of urinary (tract) infections: Secondary | ICD-10-CM | POA: Insufficient documentation

## 2015-10-05 DIAGNOSIS — F419 Anxiety disorder, unspecified: Secondary | ICD-10-CM | POA: Insufficient documentation

## 2015-10-05 NOTE — ED Notes (Signed)
c collar placed
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
## 2015-10-05 NOTE — ED Notes (Signed)
Pt discussed with dr Hyacinth Meekermiller.  She is taking a selfie after getting her c-collar placed
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
## 2015-10-05 NOTE — Discharge Instructions (Signed)

## 2015-10-05 NOTE — ED Notes (Signed)
PA at bedside.

## 2016-04-16 ENCOUNTER — Ambulatory Visit (INDEPENDENT_AMBULATORY_CARE_PROVIDER_SITE_OTHER): Payer: Self-pay

## 2016-04-16 DIAGNOSIS — Z3202 Encounter for pregnancy test, result negative: Secondary | ICD-10-CM

## 2016-04-16 NOTE — Progress Notes (Signed)
Patient presented to the office today for a possible pregnancy. She stated her last period was 03/14/2016 but her test is negative here in the office today. Patient was advised to take another pregnancy test in 2 weeks and if still negative she should make appointment  With our clinic.

## 2016-05-08 ENCOUNTER — Ambulatory Visit (INDEPENDENT_AMBULATORY_CARE_PROVIDER_SITE_OTHER): Payer: Medicaid Other | Admitting: Obstetrics

## 2016-05-08 ENCOUNTER — Encounter: Payer: Self-pay | Admitting: Obstetrics

## 2016-05-08 ENCOUNTER — Other Ambulatory Visit (HOSPITAL_COMMUNITY)
Admission: RE | Admit: 2016-05-08 | Discharge: 2016-05-08 | Disposition: A | Discharge status: Home / Self Care | Payer: Medicaid Other | Source: Ambulatory Visit | Attending: Obstetrics | Admitting: Obstetrics

## 2016-05-08 VITALS — BP 135/83 | HR 87 | Temp 98.4°F | Ht 64.0 in | Wt 187.4 lb

## 2016-05-08 DIAGNOSIS — Z1151 Encounter for screening for human papillomavirus (HPV): Secondary | ICD-10-CM

## 2016-05-08 DIAGNOSIS — Z30011 Encounter for initial prescription of contraceptive pills: Secondary | ICD-10-CM | POA: Diagnosis not present

## 2016-05-08 DIAGNOSIS — Z0000 Encounter for general adult medical examination without abnormal findings: Secondary | ICD-10-CM | POA: Diagnosis not present

## 2016-05-08 DIAGNOSIS — Z01419 Encounter for gynecological examination (general) (routine) without abnormal findings: Secondary | ICD-10-CM

## 2016-05-08 DIAGNOSIS — Z3009 Encounter for other general counseling and advice on contraception: Secondary | ICD-10-CM

## 2016-05-08 DIAGNOSIS — Z124 Encounter for screening for malignant neoplasm of cervix: Secondary | ICD-10-CM

## 2016-05-08 DIAGNOSIS — I10 Essential (primary) hypertension: Secondary | ICD-10-CM

## 2016-05-08 MED ORDER — LO LOESTRIN FE 1 MG-10 MCG / 10 MCG PO TABS: 1.0000 | ORAL_TABLET | Freq: Every day | ORAL | 11 refills | Status: DC

## 2016-05-08 NOTE — Addendum Note (Signed)
Addended by: Francene FindersJAMES, QUINETTA C on: 05/08/2016 11:13 AM   Modules accepted: Orders

## 2016-05-08 NOTE — Progress Notes (Signed)
Subjective:        Debra DeisStephanie Herrera is a 35 y.o. female here for a routine exam.  Current complaints: None.    Personal health questionnaire:  Is patient Debra Herrera, have a family history of breast and/or ovarian cancer: no Is there a family history of uterine cancer diagnosed at age < 1750, gastrointestinal cancer, urinary tract cancer, family member who is a Personnel officerLynch syndrome-associated carrier: no Is the patient overweight and hypertensive, family history of diabetes, personal history of gestational diabetes, preeclampsia or PCOS: no Is patient over 9855, have PCOS,  family history of premature CHD under age 33, diabetes, smoke, have hypertension or peripheral artery disease:  no At any time, has a partner hit, kicked or otherwise hurt or frightened you?: no Over the past 2 weeks, have you felt down, depressed or hopeless?: no Over the past 2 weeks, have you felt little interest or pleasure in doing things?:no   Gynecologic History Patient's last menstrual period was 04/20/2016 (exact date). Contraception: condoms Last Pap: unknown. Results were: unknown Last mammogram: n/a. Results were: n/a  Obstetric History OB History  Gravida Para Term Preterm AB Living  2 2          SAB TAB Ectopic Multiple Live Births          2    # Outcome Date GA Lbr Len/2nd Weight Sex Delivery Anes PTL Lv  2 Para           1 Para               History reviewed. No pertinent past medical history.  History reviewed. No pertinent surgical history.  No current outpatient prescriptions on file. Not on File  Social History  Substance Use Topics  . Smoking status: Never Smoker  . Smokeless tobacco: Never Used  . Alcohol use No    Family History  Problem Relation Age of Onset  . Diabetes Father   . Hypertension Father   . Hypertension Mother       Review of Systems  Constitutional: negative for fatigue and weight loss Respiratory: negative for cough and wheezing Cardiovascular:  negative for chest pain, fatigue and palpitations Gastrointestinal: negative for abdominal pain and change in bowel habits Musculoskeletal:negative for myalgias Neurological: negative for gait problems and tremors Behavioral/Psych: negative for abusive relationship, depression Endocrine: negative for temperature intolerance    Genitourinary:negative for abnormal menstrual periods, genital lesions, hot flashes, sexual problems and vaginal discharge Integument/breast: negative for breast lump, breast tenderness, nipple discharge and skin lesion(s)    Objective:       BP 135/83   Pulse 87   Temp 98.4 F (36.9 C) (Oral)   Ht 5\' 4"  (1.626 m)   Wt 187 lb 6.4 oz (85 kg)   LMP 04/20/2016 (Exact Date)   BMI 32.17 kg/m  General:   alert  Skin:   no rash or abnormalities  Lungs:   clear to auscultation bilaterally  Heart:   regular rate and rhythm, S1, S2 normal, no murmur, click, rub or gallop  Breasts:   normal without suspicious masses, skin or nipple changes or axillary nodes  Abdomen:  normal findings: no organomegaly, soft, non-tender and no hernia  Pelvis:  External genitalia: normal general appearance Urinary system: urethral meatus normal and bladder without fullness, nontender Vaginal: normal without tenderness, induration or masses Cervix: normal appearance Adnexa: normal bimanual exam Uterus: anteverted and non-tender, normal size   Lab Review Urine pregnancy test Labs reviewed no Radiologic  studies reviewed no  50% of 20 min visit spent on counseling and coordination of care.    Assessment:    Healthy female exam.    Contraceptive Counseling and Advice  Mild Hypertension   Plan:    Follow BP closely  Education reviewed: calcium supplements, low fat, low cholesterol diet, safe sex/STD prevention, self breast exams and weight bearing exercise. Contraception: OCP (estrogen/progesterone). Follow up in: 3 months.   No orders of the defined types were placed in this  encounter.  No orders of the defined types were placed in this encounter.   Patient ID: Debra DeisStephanie Forse, female   DOB: 04/07/1981, 35 y.o.   MRN: [id number]

## 2016-05-12 LAB — NUSWAB VG+, CANDIDA 6SP
CANDIDA ALBICANS, NAA: POSITIVE — AB
CANDIDA LUSITANIAE, NAA: NEGATIVE
CHLAMYDIA TRACHOMATIS, NAA: NEGATIVE
Candida glabrata, NAA: NEGATIVE
Candida krusei, NAA: NEGATIVE
Candida parapsilosis, NAA: NEGATIVE
Candida tropicalis, NAA: NEGATIVE
NEISSERIA GONORRHOEAE, NAA: NEGATIVE
Trich vag by NAA: NEGATIVE

## 2016-05-14 ENCOUNTER — Other Ambulatory Visit: Payer: Self-pay | Admitting: Obstetrics

## 2016-05-14 DIAGNOSIS — B373 Candidiasis of vulva and vagina: Secondary | ICD-10-CM

## 2016-05-14 DIAGNOSIS — B3731 Acute candidiasis of vulva and vagina: Secondary | ICD-10-CM

## 2016-05-14 LAB — CYTOLOGY - PAP
Diagnosis: NEGATIVE
HPV: NOT DETECTED

## 2016-05-14 MED ORDER — FLUCONAZOLE 150 MG PO TABS: 150.0000 mg | ORAL_TABLET | Freq: Once | ORAL | 2 refills | Status: AC

## 2016-05-21 ENCOUNTER — Telehealth: Payer: Self-pay

## 2016-05-21 NOTE — Telephone Encounter (Signed)
Spoke with pt and advised of lab results and rx, pt stated that she has been having trouble with her insurance but that she was waiting to hear back to see if it is worked out.

## 2016-06-04 ENCOUNTER — Encounter (HOSPITAL_COMMUNITY): Payer: Self-pay | Admitting: *Deleted

## 2017-06-04 ENCOUNTER — Ambulatory Visit: Payer: Medicaid Other | Admitting: Obstetrics

## 2017-06-17 ENCOUNTER — Encounter: Payer: Self-pay | Admitting: Obstetrics

## 2017-06-17 ENCOUNTER — Other Ambulatory Visit (HOSPITAL_COMMUNITY)
Admission: RE | Admit: 2017-06-17 | Discharge: 2017-06-17 | Disposition: A | Discharge status: Home / Self Care | Payer: Medicaid Other | Source: Ambulatory Visit | Attending: Obstetrics | Admitting: Obstetrics

## 2017-06-17 ENCOUNTER — Ambulatory Visit (INDEPENDENT_AMBULATORY_CARE_PROVIDER_SITE_OTHER): Payer: Medicaid Other | Admitting: Obstetrics

## 2017-06-17 VITALS — BP 126/84 | HR 77 | Ht 64.0 in | Wt 186.2 lb

## 2017-06-17 DIAGNOSIS — N898 Other specified noninflammatory disorders of vagina: Secondary | ICD-10-CM

## 2017-06-17 DIAGNOSIS — Z3009 Encounter for other general counseling and advice on contraception: Secondary | ICD-10-CM

## 2017-06-17 DIAGNOSIS — Z309 Encounter for contraceptive management, unspecified: Secondary | ICD-10-CM | POA: Diagnosis not present

## 2017-06-17 DIAGNOSIS — Z01419 Encounter for gynecological examination (general) (routine) without abnormal findings: Secondary | ICD-10-CM | POA: Insufficient documentation

## 2017-06-17 NOTE — Progress Notes (Addendum)
Subjective:        Debra Herrera is a 36 y.o. female here for a routine exam.  Current complaints: None.    Personal health questionnaire:  Is patient Debra Herrera, have a family history of breast and/or ovarian cancer: no Is there a family history of uterine cancer diagnosed at age < 5150, gastrointestinal cancer, urinary tract cancer, family member who is a Personnel officerLynch syndrome-associated carrier: no Is the patient overweight and hypertensive, family history of diabetes, personal history of gestational diabetes, preeclampsia or PCOS: no Is patient over 7655, have PCOS,  family history of premature CHD under age 34, diabetes, smoke, have hypertension or peripheral artery disease:  no At any time, has a partner hit, kicked or otherwise hurt or frightened you?: no Over the past 2 weeks, have you felt down, depressed or hopeless?: no Over the past 2 weeks, have you felt little interest or pleasure in doing things?:no   Gynecologic History Patient's last menstrual period was 06/07/2017. Contraception: none Last Pap: 2017. Results were: normal Last mammogram: n/a. Results were: n/a  Obstetric History OB History  Gravida Para Term Preterm AB Living  2 2 2  0 0 2  SAB TAB Ectopic Multiple Live Births  0 0 0   2    # Outcome Date GA Lbr Len/2nd Weight Sex Delivery Anes PTL Lv  2 Term 11/09/12 [id number] 13:28 / 00:51  M Vag-Spont EPI  LIV  1 Term      Vag-Spont   LIV      Past Medical History:  Diagnosis Date  . Anxiety   . Depression   . Headache(784.0)   . Human papilloma virus   . OCD (obsessive compulsive disorder)   . TMJ arthralgia   . Urinary tract infection     Past Surgical History:  Procedure Laterality Date  . ADENOIDECTOMY    . TONSILLECTOMY AND ADENOIDECTOMY      No current outpatient medications on file. Allergies  Allergen Reactions  . Benadryl [Diphenhydramine Hcl] Rash  . Sulfa Antibiotics Rash    Social History   Tobacco Use  . Smoking  status: Never Smoker  . Smokeless tobacco: Never Used  Substance Use Topics  . Alcohol use: No    Family History  Problem Relation Age of Onset  . Diabetes Father   . Hypertension Father   . Hypertension Mother   . Other Neg Hx       Review of Systems  Constitutional: negative for fatigue and weight loss Respiratory: negative for cough and wheezing Cardiovascular: negative for chest pain, fatigue and palpitations Gastrointestinal: negative for abdominal pain and change in bowel habits Musculoskeletal:negative for myalgias Neurological: negative for gait problems and tremors Behavioral/Psych: negative for abusive relationship, depression Endocrine: negative for temperature intolerance    Genitourinary:negative for abnormal menstrual periods, genital lesions, hot flashes, sexual problems and vaginal discharge Integument/breast: negative for breast lump, breast tenderness, nipple discharge and skin lesion(s)    Objective:       BP 126/84   Pulse 77   Ht 5\' 4"  (1.626 m)   Wt 186 lb 3.2 oz (84.5 kg)   LMP 06/07/2017   BMI 31.96 kg/m  General:   alert  Skin:   no rash or abnormalities  Lungs:   clear to auscultation bilaterally  Heart:   regular rate and rhythm, S1, S2 normal, no murmur, click, rub or gallop  Breasts:   normal without suspicious masses, skin or nipple changes or axillary nodes  Abdomen:  normal findings: no organomegaly, soft, non-tender and no hernia  Pelvis:  External genitalia: normal general appearance Urinary system: urethral meatus normal and bladder without fullness, nontender Vaginal: normal without tenderness, induration or masses Cervix: normal appearance Adnexa: normal bimanual exam Uterus: anteverted and non-tender, normal size   Lab Review Urine pregnancy test Labs reviewed yes Radiologic studies reviewed no  50% of 20 min visit spent on counseling and coordination of care.   Assessment and Plan:     1. Encounter for gynecological  examination with Papanicolaou smear of cervix Rx: - Cytology - PAP  2. Vaginal discharge Rx: - Cervicovaginal ancillary only  3. Encounter for other general counseling and advice on contraception - wants IUD   Plan:    Education reviewed: calcium supplements, depression evaluation, low fat, low cholesterol diet, safe sex/STD prevention, self breast exams and weight bearing exercise. Contraception: IUD. Follow up in: 2 weeks. Mirena IUD Insertion  No orders of the defined types were placed in this encounter.  No orders of the defined types were placed in this encounter.

## 2017-06-18 ENCOUNTER — Encounter: Payer: Self-pay | Admitting: Obstetrics

## 2017-06-19 LAB — CERVICOVAGINAL ANCILLARY ONLY
BACTERIAL VAGINITIS: POSITIVE — AB
CANDIDA VAGINITIS: NEGATIVE
CHLAMYDIA, DNA PROBE: NEGATIVE
Neisseria Gonorrhea: NEGATIVE
TRICH (WINDOWPATH): NEGATIVE

## 2017-06-20 ENCOUNTER — Telehealth: Payer: Self-pay

## 2017-06-20 ENCOUNTER — Other Ambulatory Visit: Payer: Self-pay | Admitting: Obstetrics

## 2017-06-20 DIAGNOSIS — B9689 Other specified bacterial agents as the cause of diseases classified elsewhere: Secondary | ICD-10-CM

## 2017-06-20 DIAGNOSIS — N760 Acute vaginitis: Principal | ICD-10-CM

## 2017-06-20 MED ORDER — SECNIDAZOLE 2 G PO PACK: 1.0000 | PACK | Freq: Once | ORAL | 2 refills | Status: AC

## 2017-06-20 NOTE — Telephone Encounter (Signed)
Advised of results and rx sent 

## 2017-06-21 LAB — CYTOLOGY - PAP
DIAGNOSIS: NEGATIVE
HPV (WINDOPATH): NOT DETECTED

## 2017-06-24 ENCOUNTER — Other Ambulatory Visit: Payer: Self-pay

## 2017-06-24 DIAGNOSIS — B9689 Other specified bacterial agents as the cause of diseases classified elsewhere: Secondary | ICD-10-CM

## 2017-06-24 DIAGNOSIS — N760 Acute vaginitis: Principal | ICD-10-CM

## 2017-06-24 MED ORDER — METRONIDAZOLE 500 MG PO TABS: 500.0000 mg | ORAL_TABLET | Freq: Two times a day (BID) | ORAL | 0 refills | Status: DC

## 2017-06-24 NOTE — Progress Notes (Signed)
Pt has [place] med[place]. Solosec is not covered by this insurance. Rx switched to flagyl

## 2017-07-01 ENCOUNTER — Ambulatory Visit (INDEPENDENT_AMBULATORY_CARE_PROVIDER_SITE_OTHER): Payer: Medicaid Other | Admitting: Obstetrics

## 2017-07-01 ENCOUNTER — Encounter: Payer: Self-pay | Admitting: Obstetrics

## 2017-07-01 VITALS — BP 121/85 | HR 78 | Wt 187.6 lb

## 2017-07-01 DIAGNOSIS — Z309 Encounter for contraceptive management, unspecified: Secondary | ICD-10-CM

## 2017-07-01 LAB — POCT URINE PREGNANCY: PREG TEST UR: NEGATIVE

## 2017-07-01 NOTE — Progress Notes (Signed)
IUD  INSERTION CANCELLED.

## 2017-07-01 NOTE — Progress Notes (Signed)
Pt here for IUD insertion. Pt states that she had unprotected IC last Friday.

## 2017-07-09 ENCOUNTER — Ambulatory Visit (INDEPENDENT_AMBULATORY_CARE_PROVIDER_SITE_OTHER): Payer: Medicaid Other | Admitting: Obstetrics

## 2017-07-09 ENCOUNTER — Encounter: Payer: Self-pay | Admitting: *Deleted

## 2017-07-09 ENCOUNTER — Encounter: Payer: Self-pay | Admitting: Obstetrics

## 2017-07-09 VITALS — BP 118/80 | HR 89 | Wt <= 1120 oz

## 2017-07-09 DIAGNOSIS — Z309 Encounter for contraceptive management, unspecified: Secondary | ICD-10-CM | POA: Diagnosis not present

## 2017-07-09 DIAGNOSIS — Z30430 Encounter for insertion of intrauterine contraceptive device: Secondary | ICD-10-CM

## 2017-07-09 LAB — POCT URINE PREGNANCY: PREG TEST UR: NEGATIVE

## 2017-07-09 MED ORDER — LEVONORGESTREL 20 MCG/24HR IU IUD
INTRAUTERINE_SYSTEM | Freq: Once | INTRAUTERINE | Status: AC
  Administered 2017-07-09: 13:00:00 via INTRAUTERINE

## 2017-07-09 NOTE — Progress Notes (Signed)
IUD Insertion Procedure Note  Pre-operative Diagnosis: Desire Long Term Reversible Contraception ( LARC )  Post-operative Diagnosis: same  Indications: contraception  Procedure Details  Urine pregnancy test was done in office and result was negative.  The risks (including infection, bleeding, pain, and uterine perforation) and benefits of the procedure were explained to the patient and Written informed consent was obtained.    Cervix cleansed with Betadine. Uterus sounded to 8 cm. IUD inserted without difficulty. String visible and trimmed. Patient tolerated procedure well.  IUD Information: Mirena, Lot # [id number], Expiration date May  2021.  Condition: Stable  Complications: None  Plan:  The patient was advised to call for any fever or for prolonged or severe pain or bleeding. She was advised to use NSAID as needed for mild to moderate pain.   Attending Physician Documentation: I was present for or participated in the entire procedure, including opening and closing.

## 2017-07-09 NOTE — Progress Notes (Addendum)
poctPresents for IUD Insertion (Mirena).  UPT today is EGATIVE.    LMP 07/09/17.

## 2017-07-15 ENCOUNTER — Ambulatory Visit: Payer: Self-pay | Admitting: Obstetrics

## 2017-08-20 ENCOUNTER — Ambulatory Visit (INDEPENDENT_AMBULATORY_CARE_PROVIDER_SITE_OTHER): Payer: Medicaid Other | Admitting: Obstetrics

## 2017-08-20 ENCOUNTER — Encounter: Payer: Self-pay | Admitting: Obstetrics

## 2017-08-20 VITALS — BP 138/86 | HR 84 | Wt 189.4 lb

## 2017-08-20 DIAGNOSIS — Z30431 Encounter for routine checking of intrauterine contraceptive device: Secondary | ICD-10-CM

## 2017-08-20 DIAGNOSIS — Z309 Encounter for contraceptive management, unspecified: Secondary | ICD-10-CM | POA: Diagnosis not present

## 2017-08-20 NOTE — Progress Notes (Signed)
Subjective:    Debra Herrera is a 37 y.o. female who presents for contraception counseling. The patient has no complaints today. The patient is sexually active. Pertinent past medical history: none.  The information documented in the HPI was reviewed and verified.  Menstrual History: OB History    Gravida Para Term Preterm AB Living   2 2 2  0 0 2   SAB TAB Ectopic Multiple Live Births   0 0 0   2       No LMP recorded. Patient is not currently having periods (Reason: IUD).   Patient Active Problem List   Diagnosis Date Noted  . Amenorrhea, secondary 02/07/2011  . DEPRESSIVE DISORDER NOT ELSEWHERE CLASSIFIED 10/12/2008   Past Medical History:  Diagnosis Date  . Anxiety   . Depression   . Headache(784.0)   . Human papilloma virus   . OCD (obsessive compulsive disorder)   . TMJ arthralgia   . Urinary tract infection     Past Surgical History:  Procedure Laterality Date  . ADENOIDECTOMY    . TONSILLECTOMY AND ADENOIDECTOMY       Current Outpatient Medications:  .  levonorgestrel (MIRENA, 52 MG,) 20 MCG/24HR IUD, Mirena 20 mcg/24 hours (5 yrs) 52 mg intrauterine device  Take 1 device by intrauterine route., Disp: , Rfl:  Allergies  Allergen Reactions  . Benadryl [Diphenhydramine Hcl] Rash  . Sulfa Antibiotics Rash    Social History   Tobacco Use  . Smoking status: Never Smoker  . Smokeless tobacco: Never Used  Substance Use Topics  . Alcohol use: No    Family History  Problem Relation Age of Onset  . Diabetes Father   . Hypertension Father   . Hypertension Mother   . Other Neg Hx        Review of Systems Constitutional: negative for weight loss Genitourinary:negative for abnormal menstrual periods and vaginal discharge   Objective:   BP 138/86   Pulse 84   Wt 189 lb 6.4 oz (85.9 kg)   BMI 32.51 kg/m           General:  Alert and no distress Abdomen:  normal findings: no organomegaly, soft, non-tender and no hernia  Pelvis:  External  genitalia: normal general appearance Urinary system: urethral meatus normal and bladder without fullness, nontender Vaginal: normal without tenderness, induration or masses Cervix: normal appearance.  IUD string visible, normal length Adnexa: normal bimanual exam Uterus: anteverted and non-tender, normal size   Lab Review Urine pregnancy test Labs reviewed yes Radiologic studies reviewed no  50% of 15 min visit spent on counseling and coordination of care.    Assessment:    37 y.o., continuing MIRENA IUD, no contraindications.   Plan:    All questions answered. Contraception: IUD. Discussed healthy lifestyle modifications. Follow up in 1 year. No orders of the defined types were placed in this encounter.  No orders of the defined types were placed in this encounter.   Brock BadHARLES A. HARPER MD

## 2017-08-31 ENCOUNTER — Emergency Department (HOSPITAL_COMMUNITY): Payer: No Typology Code available for payment source

## 2017-08-31 ENCOUNTER — Encounter (HOSPITAL_COMMUNITY): Payer: Self-pay | Admitting: Emergency Medicine

## 2017-08-31 DIAGNOSIS — M549 Dorsalgia, unspecified: Secondary | ICD-10-CM | POA: Insufficient documentation

## 2017-08-31 DIAGNOSIS — M25531 Pain in right wrist: Secondary | ICD-10-CM | POA: Insufficient documentation

## 2017-08-31 DIAGNOSIS — Y9389 Activity, other specified: Secondary | ICD-10-CM | POA: Insufficient documentation

## 2017-08-31 DIAGNOSIS — Y999 Unspecified external cause status: Secondary | ICD-10-CM | POA: Insufficient documentation

## 2017-08-31 DIAGNOSIS — S6991XA Unspecified injury of right wrist, hand and finger(s), initial encounter: Secondary | ICD-10-CM | POA: Insufficient documentation

## 2017-08-31 DIAGNOSIS — S299XXA Unspecified injury of thorax, initial encounter: Secondary | ICD-10-CM | POA: Diagnosis present

## 2017-08-31 DIAGNOSIS — Z79899 Other long term (current) drug therapy: Secondary | ICD-10-CM | POA: Diagnosis not present

## 2017-08-31 DIAGNOSIS — Y92410 Unspecified street and highway as the place of occurrence of the external cause: Secondary | ICD-10-CM | POA: Insufficient documentation
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
## 2017-09-01 ENCOUNTER — Emergency Department (HOSPITAL_COMMUNITY): Payer: No Typology Code available for payment source

## 2017-09-01 ENCOUNTER — Emergency Department (HOSPITAL_COMMUNITY)
Admission: EM | Admit: 2017-09-01 | Discharge: 2017-09-01 | Disposition: A | Discharge status: Home / Self Care | Payer: No Typology Code available for payment source | Attending: Emergency Medicine | Admitting: Emergency Medicine

## 2017-09-01 DIAGNOSIS — M25531 Pain in right wrist: Secondary | ICD-10-CM

## 2017-09-01 DIAGNOSIS — M546 Pain in thoracic spine: Secondary | ICD-10-CM

## 2017-09-01 MED ORDER — OXYCODONE-ACETAMINOPHEN 5-325 MG PO TABS
1.0000 | ORAL_TABLET | ORAL | Status: DC | PRN
  Administered 2017-09-01: 1 via ORAL
  Filled 2017-09-01: qty 1

## 2017-09-01 MED ORDER — CYCLOBENZAPRINE HCL 10 MG PO TABS: 10.0000 mg | ORAL_TABLET | Freq: Two times a day (BID) | ORAL | 0 refills | Status: DC | PRN

## 2017-09-01 NOTE — Discharge Instructions (Signed)
Take Ibuprofen three times daily for the next week. Take this medicine with food. °Take muscle relaxer at bedtime to help you sleep. This medicine makes you drowsy so do not take before driving or work °Use a heating pad for sore muscles - use for 20 minutes several times a day °Return for worsening symptoms ° °

## 2017-09-01 NOTE — ED Notes (Signed)
Pt called for room no answer
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
## 2017-09-02 LAB — POC URINE PREG, ED: Preg Test, Ur: NEGATIVE

## 2018-01-15 ENCOUNTER — Ambulatory Visit: Payer: Self-pay | Admitting: Obstetrics

## 2018-07-04 ENCOUNTER — Ambulatory Visit
Admission: EM | Admit: 2018-07-04 | Discharge: 2018-07-04 | Disposition: A | Discharge status: Home / Self Care | Payer: Medicaid Other | Attending: Emergency Medicine | Admitting: Emergency Medicine

## 2018-07-04 DIAGNOSIS — M25562 Pain in left knee: Secondary | ICD-10-CM

## 2018-07-04 DIAGNOSIS — J029 Acute pharyngitis, unspecified: Secondary | ICD-10-CM | POA: Insufficient documentation

## 2018-07-04 DIAGNOSIS — G8929 Other chronic pain: Secondary | ICD-10-CM | POA: Insufficient documentation

## 2018-07-04 DIAGNOSIS — M25561 Pain in right knee: Secondary | ICD-10-CM

## 2018-07-04 LAB — POCT RAPID STREP A (OFFICE): Rapid Strep A Screen: NEGATIVE

## 2018-07-04 NOTE — ED Triage Notes (Signed)
Pt c/o sore throat x1 wk, has a mild cough with sneezing; too advil with relief

## 2018-07-04 NOTE — Discharge Instructions (Signed)
Sore Throat  Your rapid strep tested Negative today. We will send for a culture and call in about 2 days if results are positive. For now we will treat your sore throat as a virus with symptom management.   PLEASE BEGIN ZYRTEC OR ZYRTEC-D TO HELP WITH DRAINAGE THAT MAY BE IRRITATING YOUR THROAT  Please continue Tylenol or Ibuprofen for fever and pain. May try salt water gargles, cepacol lozenges, throat spray, or OTC cold relief medicine for throat discomfort. If you also have congestion take a daily anti-histamine like Zyrtec, Claritin, and a oral decongestant to help with post nasal drip that may be irritating your throat.   Stay hydrated and drink plenty of fluids to keep your throat coated relieve irritation.   Knee Pain Use anti-inflammatories for pain/swelling. You may take up to 800 mg Ibuprofen every 8 hours with food. You may supplement Ibuprofen with Tylenol [phone] mg every 8 hours.

## 2018-07-04 NOTE — ED Provider Notes (Signed)
[facility]    CSN: [id number] Arrival date & time: 07/04/18  1238     History   Chief Complaint Chief Complaint  Patient presents with  . Sore Throat    HPI Debra Herrera is a 38 y.o. female history of previous tonsillectomy and adenoidectomy presenting today for evaluation of a sore throat.  Patient has had a sore throat for 1 week.  She has had a mild occasional cough associated and some sneezing, but her main complaint is her sore throat.  She is concerned about strep.  She has had painful swallowing and decreased oral intake due to this.  She denies any fevers.  She is tried using Hall's and Advil without relief.  Denies any nausea or vomiting.  Patient also notes that she has had bilateral knee pain.  Notices this more when she goes up steps, with bending or getting out of the bathtub.  She does not take anything for it.  She feels a popping sensation frequently.  Denies any injury.  HPI  Past Medical History:  Diagnosis Date  . Anxiety   . Depression   . Headache(784.0)   . Human papilloma virus   . OCD (obsessive compulsive disorder)   . TMJ arthralgia   . Urinary tract infection     Patient Active Problem List   Diagnosis Date Noted  . Amenorrhea, secondary 02/07/2011  . DEPRESSIVE DISORDER NOT ELSEWHERE CLASSIFIED 10/12/2008    Past Surgical History:  Procedure Laterality Date  . ADENOIDECTOMY    . TONSILLECTOMY AND ADENOIDECTOMY      OB History    Gravida  2   Para  2   Term  2   Preterm  0   AB  0   Living  2     SAB  0   TAB  0   Ectopic  0   Multiple      Live Births  2            Home Medications    Prior to Admission medications   Medication Sig Start Date End Date Taking? Authorizing Provider  cyclobenzaprine (FLEXERIL) 10 MG tablet Take 1 tablet (10 mg total) by mouth 2 (two) times daily as needed for muscle spasms. 09/01/17   Bethel Born, PA-C  levonorgestrel (MIRENA, 52 MG,) 20 MCG/24HR  IUD Mirena 20 mcg/24 hours (5 yrs) 52 mg intrauterine device  Take 1 device by intrauterine route.    [provider]    Family History Family History  Problem Relation Age of Onset  . Diabetes Father   . Hypertension Father   . Hypertension Mother   . Other Neg Hx     Social History Social History   Tobacco Use  . Smoking status: Never Smoker  . Smokeless tobacco: Never Used  Substance Use Topics  . Alcohol use: No  . Drug use: No     Allergies   Benadryl [diphenhydramine hcl] and Sulfa antibiotics   Review of Systems Review of Systems  Constitutional: Negative for activity change, appetite change, chills, fatigue and fever.  HENT: Positive for congestion, sneezing and sore throat. Negative for ear pain, rhinorrhea, sinus pressure and trouble swallowing.   Eyes: Negative for discharge and redness.  Respiratory: Positive for cough. Negative for chest tightness and shortness of breath.   Cardiovascular: Negative for chest pain.  Gastrointestinal: Negative for abdominal pain, diarrhea, nausea and vomiting.  Musculoskeletal: Positive for arthralgias. Negative for myalgias.  Skin: Negative for  rash.  Neurological: Negative for dizziness, light-headedness and headaches.     Physical Exam Triage Vital Signs ED Triage Vitals [07/04/18 1303]  Enc Vitals Group     BP 122/80     Pulse Rate 76     Resp 18     Temp 97.8 F (36.6 C)     Temp Source Oral     SpO2 96 %     Weight      Height      Head Circumference      Peak Flow      Pain Score 0     Pain Loc      Pain Edu?      Excl. in GC?    No data found.  Updated Vital Signs BP 122/80 (BP Location: Left Arm)   Pulse 76   Temp 97.8 F (36.6 C) (Oral)   Resp 18   SpO2 96%   Visual Acuity Right Eye Distance:   Left Eye Distance:   Bilateral Distance:    Right Eye Near:   Left Eye Near:    Bilateral Near:     Physical Exam Vitals signs and nursing note reviewed.  Constitutional:       General: She is not in acute distress.    Appearance: She is well-developed.  HENT:     Head: Normocephalic and atraumatic.     Ears:     Comments: Bilateral ears without tenderness to palpation of external auricle, tragus and mastoid, EAC's without erythema or swelling, TM's with good bony landmarks and cone of light. Non erythematous.  Right TM with effusion, nonerythematous    Nose:     Comments: Nasal mucosa erythematous, mildly swollen turbinates with rhinorrhea present    Mouth/Throat:     Comments: Oral mucosa pink and moist, no tonsillar enlargement or exudate. Posterior pharynx patent and erythematous, no uvula deviation or swelling. Normal phonation.  Eyes:     Conjunctiva/sclera: Conjunctivae normal.  Neck:     Musculoskeletal: Neck supple.  Cardiovascular:     Rate and Rhythm: Normal rate and regular rhythm.     Heart sounds: No murmur.  Pulmonary:     Effort: Pulmonary effort is normal. No respiratory distress.     Breath sounds: Normal breath sounds.     Comments: Breathing comfortably at rest, CTABL, no wheezing, rales or other adventitious sounds auscultated Abdominal:     Palpations: Abdomen is soft.     Tenderness: There is no abdominal tenderness.  Musculoskeletal:     Comments: Full active range of motion of bilateral knees, no overlying swelling or erythema, palpable crepitus with bending bilaterally  Skin:    General: Skin is warm and dry.  Neurological:     Mental Status: She is alert.      UC Treatments / Results  Labs (all labs ordered are listed, but only abnormal results are displayed) Labs Reviewed  CULTURE, GROUP A STREP [facility])  POCT RAPID STREP A (OFFICE)    EKG None  Radiology No results found.  Procedures Procedures (including critical care time)  Medications Ordered in UC Medications - No data to display  Initial Impression / Assessment and Plan / UC Course  I have reviewed the triage vital signs and the nursing  notes.  Pertinent labs & imaging results that were available during my care of the patient were reviewed by me and considered in my medical decision making (see chart for details).     Strep test negative, most  likely postnasal drainage versus other viral etiology causing sore throat.  Recommending starting daily Zyrtec.  Recommended Flonase for ear effusion, but patient was resistant to nasal sprays.  Discussed she may try the Zyrtec-D as alternative.  Discussed further symptomatic management of sore throat.  Continue Tylenol and ibuprofen.  Knee pain likely from underlying arthritis given crepitus.  Recommended anti-inflammatories.  Discussed strict return precautions. Patient verbalized understanding and is agreeable with plan.  Final Clinical Impressions(s) / UC Diagnoses   Final diagnoses:  Sore throat  Chronic pain of both knees     Discharge Instructions     Sore Throat  Your rapid strep tested Negative today. We will send for a culture and call in about 2 days if results are positive. For now we will treat your sore throat as a virus with symptom management.   PLEASE BEGIN ZYRTEC OR ZYRTEC-D TO HELP WITH DRAINAGE THAT MAY BE IRRITATING YOUR THROAT  Please continue Tylenol or Ibuprofen for fever and pain. May try salt water gargles, cepacol lozenges, throat spray, or OTC cold relief medicine for throat discomfort. If you also have congestion take a daily anti-histamine like Zyrtec, Claritin, and a oral decongestant to help with post nasal drip that may be irritating your throat.   Stay hydrated and drink plenty of fluids to keep your throat coated relieve irritation.   Knee Pain Use anti-inflammatories for pain/swelling. You may take up to 800 mg Ibuprofen every 8 hours with food. You may supplement Ibuprofen with Tylenol [phone] mg every 8 hours.     ED Prescriptions    None     Controlled Substance Prescriptions [place] Controlled Substance Registry consulted? Not  Applicable   Lew DawesWieters, Celisa Schoenberg C, [place] JerseyPA-C 07/04/18 1401

## 2018-07-09 LAB — CULTURE, GROUP A STREP (THRC)

## 2018-09-18 DIAGNOSIS — F439 Reaction to severe stress, unspecified: Secondary | ICD-10-CM | POA: Insufficient documentation

## 2018-09-18 DIAGNOSIS — F603 Borderline personality disorder: Secondary | ICD-10-CM | POA: Insufficient documentation

## 2018-09-18 DIAGNOSIS — F339 Major depressive disorder, recurrent, unspecified: Secondary | ICD-10-CM | POA: Insufficient documentation

## 2018-09-18 DIAGNOSIS — F411 Generalized anxiety disorder: Secondary | ICD-10-CM | POA: Insufficient documentation

## 2019-11-24 ENCOUNTER — Other Ambulatory Visit: Payer: Self-pay

## 2019-11-24 ENCOUNTER — Encounter (HOSPITAL_COMMUNITY): Payer: Self-pay | Admitting: Psychiatry

## 2019-11-24 ENCOUNTER — Ambulatory Visit (INDEPENDENT_AMBULATORY_CARE_PROVIDER_SITE_OTHER): Payer: No Payment, Other | Admitting: Psychiatry

## 2019-11-24 ENCOUNTER — Ambulatory Visit (HOSPITAL_COMMUNITY): Payer: Self-pay | Admitting: Licensed Clinical Social Worker

## 2019-11-24 DIAGNOSIS — F3341 Major depressive disorder, recurrent, in partial remission: Secondary | ICD-10-CM | POA: Diagnosis not present

## 2019-11-24 MED ORDER — ESCITALOPRAM OXALATE 10 MG PO TABS: 10.0000 mg | ORAL_TABLET | Freq: Every day | ORAL | 2 refills | Status: DC

## 2019-11-24 MED ORDER — HYDROXYZINE HCL 10 MG PO TABS: 10.0000 mg | ORAL_TABLET | Freq: Two times a day (BID) | ORAL | 2 refills | Status: AC | PRN
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
## 2019-12-31 ENCOUNTER — Ambulatory Visit
Admission: EM | Admit: 2019-12-31 | Discharge: 2019-12-31 | Disposition: A | Discharge status: Home / Self Care | Payer: Self-pay | Attending: Physician Assistant | Admitting: Physician Assistant

## 2019-12-31 ENCOUNTER — Other Ambulatory Visit: Payer: Self-pay

## 2019-12-31 DIAGNOSIS — L03311 Cellulitis of abdominal wall: Secondary | ICD-10-CM

## 2019-12-31 MED ORDER — MUPIROCIN 2 % EX OINT: 1.0000 "application " | TOPICAL_OINTMENT | Freq: Two times a day (BID) | CUTANEOUS | 0 refills | Status: DC

## 2019-12-31 MED ORDER — CEPHALEXIN 500 MG PO CAPS: 500.0000 mg | ORAL_CAPSULE | Freq: Four times a day (QID) | ORAL | 0 refills | Status: DC

## 2019-12-31 NOTE — ED Provider Notes (Signed)
[facility]    CSN: [id number] Arrival date & time: 12/31/19  1135      History   Chief Complaint Chief Complaint  Patient presents with   Abscess   Fatigue    HPI Debra Herrera is a 39 y.o. female.   39 year old female comes in for 4 day history of possible abscess to the right abdominal wall. States had tried squeezing it, causing worsening swelling, pain. Denies fevers.  Patient also complains of fatigue/mood swings/irritability x 6 months. Prescribed lexapro and hydroxyzine PRN, but has not tried hydroxyzine. Denies SI/HI.     Past Medical History:  Diagnosis Date   Anxiety    Depression    Headache(784.0)    Human papilloma virus    OCD (obsessive compulsive disorder)    TMJ arthralgia    Urinary tract infection     Patient Active Problem List   Diagnosis Date Noted   Amenorrhea, secondary 02/07/2011   DEPRESSIVE DISORDER NOT ELSEWHERE CLASSIFIED 10/12/2008    Past Surgical History:  Procedure Laterality Date   ADENOIDECTOMY     TONSILLECTOMY AND ADENOIDECTOMY      OB History    Gravida  2   Para  2   Term  2   Preterm  0   AB  0   Living  2     SAB  0   TAB  0   Ectopic  0   Multiple      Live Births  2            Home Medications    Prior to Admission medications   Medication Sig Start Date End Date Taking? Authorizing Provider  cephALEXin (KEFLEX) 500 MG capsule Take 1 capsule (500 mg total) by mouth 4 (four) times daily. 12/31/19   Cathie Hoops, Shigeo Baugh V, PA-C  escitalopram (LEXAPRO) 10 MG tablet Take 1 tablet (10 mg total) by mouth daily. 11/24/19 02/22/20  Shanna Cisco, NP  hydrOXYzine (ATARAX/VISTARIL) 10 MG tablet Take 1 tablet (10 mg total) by mouth 2 (two) times daily as needed. 11/24/19 02/22/20  Shanna Cisco, NP  levonorgestrel (MIRENA, 52 MG,) 20 MCG/24HR IUD Mirena 20 mcg/24 hours (5 yrs) 52 mg intrauterine device  Take 1 device by intrauterine route.    [provider]    mupirocin ointment (BACTROBAN) 2 % Apply 1 application topically 2 (two) times daily. 12/31/19   Belinda Fisher, PA-C    Family History Family History  Problem Relation Age of Onset   Diabetes Father    Hypertension Father    Hypertension Mother    Other Neg Hx     Social History Social History   Tobacco Use   Smoking status: Never Smoker   Smokeless tobacco: Never Used  Building services engineer Use: Never used  Substance Use Topics   Alcohol use: No   Drug use: No     Allergies   Benadryl [diphenhydramine hcl] and Sulfa antibiotics   Review of Systems Review of Systems  Reason unable to perform ROS: See HPI as above.     Physical Exam Triage Vital Signs ED Triage Vitals [12/31/19 1145]  Enc Vitals Group     BP 122/82     Pulse Rate (!) 102     Resp 18     Temp 98.8 F (37.1 C)     Temp src      SpO2 97 %     Weight  Height      Head Circumference      Peak Flow      Pain Score 6     Pain Loc      Pain Edu?      Excl. in GC?    No data found.  Updated Vital Signs BP 122/82    Pulse (!) 102    Temp 98.8 F (37.1 C)    Resp 18    SpO2 97%   Physical Exam Constitutional:      General: She is not in acute distress.    Appearance: Normal appearance. She is well-developed. She is not toxic-appearing or diaphoretic.  HENT:     Head: Normocephalic and atraumatic.  Eyes:     Conjunctiva/sclera: Conjunctivae normal.     Pupils: Pupils are equal, round, and reactive to light.  Pulmonary:     Effort: Pulmonary effort is normal. No respiratory distress.     Comments: Speaking in full sentences without difficulty Musculoskeletal:     Cervical back: Normal range of motion and neck supple.  Skin:    General: Skin is warm and dry.     Comments: apprx 1cm x 1cm erythema with tenderness and induration. No fluctuance, warmth.  Neurological:     Mental Status: She is alert and oriented to person, place, and time.      UC Treatments / Results   Labs (all labs ordered are listed, but only abnormal results are displayed) Labs Reviewed - No data to display  EKG   Radiology No results found.  Procedures Procedures (including critical care time)  Medications Ordered in UC Medications - No data to display  Initial Impression / Assessment and Plan / UC Course  I have reviewed the triage vital signs and the nursing notes.  Pertinent labs & imaging results that were available during my care of the patient were reviewed by me and considered in my medical decision making (see chart for details).    Will treat with keflex, warm compress. Wound care instructions given. Return precautions given.  Discussed to follow up with PCP for chronic symptoms for further workup. Patient had denied SI/HI.  Final Clinical Impressions(s) / UC Diagnoses   Final diagnoses:  Cellulitis of abdominal wall    ED Prescriptions    Medication Sig Dispense Auth. Provider   cephALEXin (KEFLEX) 500 MG capsule Take 1 capsule (500 mg total) by mouth 4 (four) times daily. 28 capsule Debra Herrera V, PA-C   mupirocin ointment (BACTROBAN) 2 % Apply 1 application topically 2 (two) times daily. 22 g Belinda Fisher, PA-C     PDMP not reviewed this encounter.   Belinda Fisher, PA-C 12/31/19 1217

## 2019-12-31 NOTE — ED Triage Notes (Signed)
Pt has small abscess to right abdominal wall x 4 days  Pt also c/o extreme fatigue and mood swings/irritability x 6 months, pt prescribed lexapro and hydroxyzine PRN but has not tried to take the hydroxyzine

## 2019-12-31 NOTE — Discharge Instructions (Signed)
Start keflex as directed. Bactroban to dress wound as needed. Warm compress, need to be warm for 10-15 mins at a time, do it for 2-3 times a day. If worsening pain, swelling, fever, go to the emergency department for further evaluation.

## 2020-02-24 ENCOUNTER — Ambulatory Visit (INDEPENDENT_AMBULATORY_CARE_PROVIDER_SITE_OTHER): Payer: No Payment, Other | Admitting: Psychiatry

## 2020-02-24 ENCOUNTER — Encounter (HOSPITAL_COMMUNITY): Payer: Self-pay | Admitting: Psychiatry

## 2020-02-24 ENCOUNTER — Other Ambulatory Visit: Payer: Self-pay

## 2020-02-24 DIAGNOSIS — F3341 Major depressive disorder, recurrent, in partial remission: Secondary | ICD-10-CM

## 2020-02-24 MED ORDER — HYDROXYZINE HCL 10 MG PO TABS: 10.0000 mg | ORAL_TABLET | Freq: Two times a day (BID) | ORAL | 2 refills | Status: DC | PRN

## 2020-02-24 MED ORDER — ESCITALOPRAM OXALATE 10 MG PO TABS: 10.0000 mg | ORAL_TABLET | Freq: Every day | ORAL | 2 refills | Status: DC

## 2020-02-24 NOTE — Progress Notes (Signed)
BH MD/PA/NP OP Progress Note  02/24/2020 11:20 AM Debra Herrera  MRN:  [id number]  Chief Complaint:  "My sleep schedule is off but other that that i'm fine"  HPI: 39 year old female seen for follow up psychiatric evaluation.  She is currently prescribed Lexapro 10 mg daily and hydroxyzine 10 mg twice daily for anxiety. She has a psychiatric history of anxiety, OCD, and depression.   She notes that current medication regimen is effective in managing her psychiatric conditions.  Today she is well groomed, pleasant, cooperative, engaged in conversation, and maintained eye contact. She denies symptoms of anxiety, depression, SI/HI/VAH, mania, or paranoia. Patient notes that she feels tired most days. 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 She notes that her sleep scheduled is off. She informed Clinical research associate that she sleeps about 6 hours a night but still feels tired. Provider asked patient to describe her sleep hygine. She notes that she tries to settle down at 10 P.M., feeds her fishes, turns the lights off, and turns the TV off. She however notes her sleep is disrupted by her children or husband. At this time she notes that she does not want to start new medications to help her sleep and reports that she can cope with disrubtions.     Patient notes that she only takes her hydroxyzine when she is frustrated with her children. She will continue all medications as prescribed. No other concerns noted at this time.  Visit Diagnosis:    ICD-10-CM   1. Recurrent major depressive disorder, in partial remission (HCC)  F33.41 escitalopram (LEXAPRO) 10 MG tablet    hydrOXYzine (ATARAX/VISTARIL) 10 MG tablet    Past Psychiatric History: Anxiety, OCD, and depression  Past Medical History:  Past Medical History:  Diagnosis Date   Anxiety    Depression    Headache(784.0)    Human papilloma virus    OCD  (obsessive compulsive disorder)    TMJ arthralgia    Urinary tract infection     Past Surgical History:  Procedure Laterality Date   ADENOIDECTOMY     TONSILLECTOMY AND ADENOIDECTOMY      Family Psychiatric History: Brother Bipolar disorder, Mother OCD  Family History:  Family History  Problem Relation Age of Onset   Diabetes Father    Hypertension Father    Hypertension Mother    Other Neg Hx     Social History:  Social History   Socioeconomic History   Marital status: Married    Spouse name: Not on file   Number of children: Not on file   Years of education: Not on file   Highest education level: Not on file  Occupational History   Not on file  Tobacco Use   Smoking status: Never Smoker   Smokeless tobacco: Never Used  Vaping Use   Vaping Use: Never used  Substance and Sexual Activity   Alcohol use: No   Drug use: No   Sexual activity: Yes    Partners: Male    Birth control/protection: None  Other Topics Concern   Not on file  Social History Narrative   ** Merged History Encounter **       Social Determinants of Health   Financial Resource Strain:    Difficulty of Paying Living Expenses: Not on file  Food Insecurity:    Worried About Radiation protection practitioner of Food in the Last Year: Not on file   [employer] of Food in  the Last Year: Not on file  Transportation Needs:    Lack of Transportation (Medical): Not on file   Lack of Transportation (Non-Medical): Not on file  Physical Activity:    Days of Exercise per Week: Not on file   Minutes of Exercise per Session: Not on file  Stress:    Feeling of Stress : Not on file  Social Connections:    Frequency of Communication with Friends and Family: Not on file   Frequency of Social Gatherings with Friends and Family: Not on file   Attends Religious Services: Not on file   Active Member of Clubs or Organizations: Not on file   Attends Banker Meetings: Not on file    Marital Status: Not on file    Allergies:  Allergies  Allergen Reactions   Benadryl [Diphenhydramine Hcl] Rash   Sulfa Antibiotics Rash    Metabolic Disorder Labs: No results found for: HGBA1C, MPG No results found for: PROLACTIN No results found for: CHOL, TRIG, HDL, CHOLHDL, VLDL, LDLCALC No results found for: TSH  Therapeutic Level Labs: No results found for: LITHIUM No results found for: VALPROATE No components found for:  CBMZ  Current Medications: Current Outpatient Medications  Medication Sig Dispense Refill   cephALEXin (KEFLEX) 500 MG capsule Take 1 capsule (500 mg total) by mouth 4 (four) times daily. 28 capsule 0   escitalopram (LEXAPRO) 10 MG tablet Take 1 tablet (10 mg total) by mouth daily. 30 tablet 2   hydrOXYzine (ATARAX/VISTARIL) 10 MG tablet Take 1 tablet (10 mg total) by mouth 2 (two) times daily as needed. 60 tablet 2   levonorgestrel (MIRENA, 52 MG,) 20 MCG/24HR IUD Mirena 20 mcg/24 hours (5 yrs) 52 mg intrauterine device  Take 1 device by intrauterine route.     mupirocin ointment (BACTROBAN) 2 % Apply 1 application topically 2 (two) times daily. 22 g 0   No current facility-administered medications for this visit.     Musculoskeletal: Strength & Muscle Tone: within normal limits Gait & Station: normal Patient leans: N/A  Psychiatric Specialty Exam: Review of Systems  There were no vitals taken for this visit.There is no height or weight on file to calculate BMI.  General Appearance: Well Groomed  Eye Contact:  Good  Speech:  Clear and Coherent and Normal Rate  Volume:  Normal  Mood:  Euthymic  Affect:  Appropriate and Congruent  Thought Process:  Coherent, Goal Directed and Linear  Orientation:  Full (Time, Place, and Person)  Thought Content: WDL and Logical   Suicidal Thoughts:  No  Homicidal Thoughts:  No  Memory:  Immediate;   Good Recent;   Good Remote;   Good  Judgement:  Good  Insight:  Good  Psychomotor Activity:   Normal  Concentration:  Concentration: Good and Attention Span: Good  Recall:  Good  Fund of Knowledge: Good  Language: Good  Akathisia:  No  Handed:  Right  AIMS (if indicated): Not done  Assets:  Communication Skills Desire for Improvement Financial Resources/Insurance Housing Social Support  ADL's:  Intact  Cognition: WNL  Sleep:  Fair   Screenings:   Assessment and Plan: Patient endorses disturbed sleep and feeling tired. She however reports that she has been doing well on current medication regimen.  She is agreeable to continue medications as prescribed.  1. Recurrent major depressive disorder, in partial remission (HCC)  Continue- escitalopram (LEXAPRO) 10 MG tablet; Take 1 tablet (10 mg total) by mouth daily.  Dispense: 30 tablet;  Refill: 2 Continue- hydrOXYzine (ATARAX/VISTARIL) 10 MG tablet; Take 1 tablet (10 mg total) by mouth 2 (two) times daily as needed.  Dispense: 60 tablet; Refill: 2  Follow up in 3 months.    Shanna Cisco, NP 02/24/2020, 11:20 AM

## 2020-05-25 ENCOUNTER — Other Ambulatory Visit (HOSPITAL_COMMUNITY): Payer: Self-pay | Admitting: Psychiatry

## 2020-05-25 ENCOUNTER — Ambulatory Visit (INDEPENDENT_AMBULATORY_CARE_PROVIDER_SITE_OTHER): Payer: No Payment, Other | Admitting: Psychiatry

## 2020-05-25 ENCOUNTER — Telehealth (HOSPITAL_COMMUNITY): Payer: Self-pay | Admitting: *Deleted

## 2020-05-25 ENCOUNTER — Encounter (HOSPITAL_COMMUNITY): Payer: Self-pay | Admitting: Psychiatry

## 2020-05-25 ENCOUNTER — Other Ambulatory Visit: Payer: Self-pay

## 2020-05-25 DIAGNOSIS — F3341 Major depressive disorder, recurrent, in partial remission: Secondary | ICD-10-CM | POA: Diagnosis not present

## 2020-05-25 MED ORDER — HYDROXYZINE HCL 10 MG PO TABS: 10.0000 mg | ORAL_TABLET | Freq: Two times a day (BID) | ORAL | 2 refills | Status: DC | PRN

## 2020-05-25 MED ORDER — ESCITALOPRAM OXALATE 10 MG PO TABS: 10.0000 mg | ORAL_TABLET | Freq: Every day | ORAL | 2 refills | Status: DC

## 2020-05-25 NOTE — Telephone Encounter (Signed)
Call from patient wanting her medicines changed to 90 day supply at a time. She has already picked up her medicines for this month, will ask the provider to d/c refills and call in 90 days for next pick up which will be around 01//6/22.

## 2020-05-25 NOTE — Progress Notes (Signed)
BH MD/PA/NP OP Progress Note  05/25/2020 11:54 AM Debra Herrera  MRN:  [id number]  Chief Complaint: " I am stressed and frustrated" Chief Complaint    Follow-up; Medication Management       HPI: 39 year old female seen for follow up psychiatric evaluation. She has a psychiatric history of anxiety, OCD, and depression. She is currently prescribed Lexapro 10 mg daily and hydroxyzine 10 mg twice daily for anxiety.  Today she noted that current medication regimen is effective in managing her psychiatric conditions.  Today she is well groomed, pleasant, cooperative, engaged in conversation, and maintained eye contact. She informed provider that life stressors exacerbates her anxiety and depression.  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  She noted that her husband prefers to save money for a car (noting that the family needs two cars instead of one) instead of purchasing present.  She also noted that at times she becomes overwhelmed with her son who has ADHD and autism. Today provider conducted a GAD-7 and patient scored a 7.  Provider also conducted a PHQ-9 and patient scored a 12.  She noted that overall her Lexapro is managing her psychiatric conditions and reports that she has support of friends as well as her mother to talk to when she becomes overwhelmed.  Provider recommended patient also talk to her therapist however she noted that she does not want to start therapy at this time.    No medication changes made today.  Patient agreeable to continue medication as prescribed.  She will follow-up with provider in 3 months for further evaluation.  No other concerns at this time.    Visit Diagnosis:    ICD-10-CM   1. Recurrent major depressive disorder, in partial remission (HCC)  F33.41 escitalopram (LEXAPRO) 10 MG tablet    hydrOXYzine (ATARAX/VISTARIL) 10 MG  tablet    Past Psychiatric History: Anxiety, OCD, and depression  Past Medical History:  Past Medical History:  Diagnosis Date  . Anxiety   . Depression   . Headache(784.0)   . Human papilloma virus   . OCD (obsessive compulsive disorder)   . TMJ arthralgia   . Urinary tract infection     Past Surgical History:  Procedure Laterality Date  . ADENOIDECTOMY    . TONSILLECTOMY AND ADENOIDECTOMY      Family Psychiatric History: Brother Bipolar disorder, Mother OCD  Family History:  Family History  Problem Relation Age of Onset  . Diabetes Father   . Hypertension Father   . Hypertension Mother   . Other Neg Hx     Social History:  Social History   Socioeconomic History  . Marital status: Married    Spouse name: Not on file  . Number of children: Not on file  . Years of education: Not on file  . Highest education level: Not on file  Occupational History  . Not on file  Tobacco Use  . Smoking status: Never Smoker  . Smokeless tobacco: Never Used  Vaping Use  . Vaping Use: Never used  Substance and Sexual Activity  . Alcohol use: No  . Drug use: No  . Sexual activity: Yes    Partners: Male    Birth control/protection: None  Other Topics Concern  . Not on file  Social History Narrative   ** Merged History Encounter **       Social Determinants of Health   Financial  Resource Strain:   . Difficulty of Paying Living Expenses: Not on file  Food Insecurity:   . Worried About Programme researcher, broadcasting/film/video in the Last Year: Not on file  . Ran Out of Food in the Last Year: Not on file  Transportation Needs:   . Lack of Transportation (Medical): Not on file  . Lack of Transportation (Non-Medical): Not on file  Physical Activity:   . Days of Exercise per Week: Not on file  . Minutes of Exercise per Session: Not on file  Stress:   . Feeling of Stress : Not on file  Social Connections:   . Frequency of Communication with Friends and Family: Not on file  . Frequency of  Social Gatherings with Friends and Family: Not on file  . Attends Religious Services: Not on file  . Active Member of Clubs or Organizations: Not on file  . Attends Banker Meetings: Not on file  . Marital Status: Not on file    Allergies:  Allergies  Allergen Reactions  . Benadryl [Diphenhydramine Hcl] Rash  . Sulfa Antibiotics Rash    Metabolic Disorder Labs: No results found for: HGBA1C, MPG No results found for: PROLACTIN No results found for: CHOL, TRIG, HDL, CHOLHDL, VLDL, LDLCALC No results found for: TSH  Therapeutic Level Labs: No results found for: LITHIUM No results found for: VALPROATE No components found for:  CBMZ  Current Medications: Current Outpatient Medications  Medication Sig Dispense Refill  . levonorgestrel (MIRENA, 52 MG,) 20 MCG/24HR IUD Mirena 20 mcg/24 hours (5 yrs) 52 mg intrauterine device  Take 1 device by intrauterine route.    Marland Kitchen escitalopram (LEXAPRO) 10 MG tablet Take 1 tablet (10 mg total) by mouth daily. 30 tablet 2  . hydrOXYzine (ATARAX/VISTARIL) 10 MG tablet Take 1 tablet (10 mg total) by mouth 2 (two) times daily as needed. 60 tablet 2   No current facility-administered medications for this visit.     Musculoskeletal: Strength & Muscle Tone: within normal limits Gait & Station: normal Patient leans: N/A  Psychiatric Specialty Exam: Review of Systems  Blood pressure 115/76, pulse 78, height 5\' 4"  (1.626 m), weight 203 lb (92.1 kg), SpO2 96 %.Body mass index is 34.84 kg/m.  General Appearance: Well Groomed  Eye Contact:  Good  Speech:  Clear and Coherent and Normal Rate  Volume:  Normal  Mood:  Euthymic and Informed provider that she is anxious and depressed some days due to life stressors  Affect:  Appropriate and Congruent  Thought Process:  Coherent, Goal Directed and Linear  Orientation:  Full (Time, Place, and Person)  Thought Content: WDL and Logical   Suicidal Thoughts:  No  Homicidal Thoughts:  No   Memory:  Immediate;   Good Recent;   Good Remote;   Good  Judgement:  Good  Insight:  Good  Psychomotor Activity:  Normal  Concentration:  Concentration: Good and Attention Span: Good  Recall:  Good  Fund of Knowledge: Good  Language: Good  Akathisia:  No  Handed:  Right  AIMS (if indicated): Not done  Assets:  Communication Skills Desire for Improvement Financial Resources/Insurance Housing Social Support  ADL's:  Intact  Cognition: WNL  Sleep:  Fair   Screenings: GAD-7     Clinical Support [facility] 05/25/2020 in [facility]  Total GAD-7 Score 7    PHQ2-9     Clinical Support from 05/25/2020 in [facility]  PHQ-2 Total Score 5  PHQ-9  Total Score 12       Assessment and Plan: Patient endorses occasional anxiety and depression due to life stressors.  She however noted that her medications are effective in managing her psychiatric conditions.  No medication changes made today.  Patient agreeable to continue all medications as prescribed for  1. Recurrent major depressive disorder, in partial remission (HCC)  Continue- escitalopram (LEXAPRO) 10 MG tablet; Take 1 tablet (10 mg total) by mouth daily.  Dispense: 30 tablet; Refill: 2 Continue- hydrOXYzine (ATARAX/VISTARIL) 10 MG tablet; Take 1 tablet (10 mg total) by mouth 2 (two) times daily as needed.  Dispense: 60 tablet; Refill: 2  Follow up in 3 months.    Shanna Cisco, NP 05/25/2020, 11:54 AM

## 2020-05-25 NOTE — Telephone Encounter (Signed)
Provider called verbal order to [facility] pharmacy of [place] 10 mg # 90 and hydroxyzine #180 (three months supplies with no refill).

## 2020-07-14 ENCOUNTER — Encounter: Payer: Self-pay | Admitting: Obstetrics

## 2020-07-14 ENCOUNTER — Ambulatory Visit: Payer: Self-pay | Admitting: Obstetrics

## 2020-07-14 ENCOUNTER — Other Ambulatory Visit: Payer: Self-pay

## 2020-07-14 VITALS — BP 123/80 | HR 81 | Ht 63.0 in | Wt 204.2 lb

## 2020-07-14 DIAGNOSIS — Z3009 Encounter for other general counseling and advice on contraception: Secondary | ICD-10-CM

## 2020-07-14 DIAGNOSIS — Z30432 Encounter for removal of intrauterine contraceptive device: Secondary | ICD-10-CM

## 2020-07-14 NOTE — Progress Notes (Signed)
    GYNECOLOGY OFFICE PROCEDURE NOTE  Debra Herrera is a 41 y.o. [id number] here for Liletta IUD removal. No GYN concerns.  Last pap smear was In 2018 and was normal.  IUD Removal  Patient identified, informed consent performed, consent signed.  Patient was in the dorsal lithotomy position, normal external genitalia was noted.  A speculum was placed in the patient's vagina, normal discharge was noted, no lesions. The cervix was visualized, no lesions, no abnormal discharge.  The strings of the IUD were grasped and pulled using ring forceps. The IUD was removed in its entirety.   Patient tolerated the procedure well.    Patient will use condoms for contraception.  Routine preventative health maintenance measures emphasized.   Brock Bad, MD, FACOG Obstetrician & Gynecologist, [facility], [facility] 07/14/20

## 2020-07-14 NOTE — Progress Notes (Signed)
Patient is in the office for IUD removal. Inserted on 07/09/2017 Pt is self-pay and does not wish to get another form of BC today. Last pap 06/17/2017

## 2020-08-16 ENCOUNTER — Ambulatory Visit (INDEPENDENT_AMBULATORY_CARE_PROVIDER_SITE_OTHER): Payer: Self-pay | Admitting: Podiatry

## 2020-08-16 ENCOUNTER — Other Ambulatory Visit: Payer: Self-pay

## 2020-08-16 ENCOUNTER — Encounter: Payer: Self-pay | Admitting: Podiatry

## 2020-08-16 ENCOUNTER — Ambulatory Visit (INDEPENDENT_AMBULATORY_CARE_PROVIDER_SITE_OTHER): Payer: Self-pay

## 2020-08-16 DIAGNOSIS — N9089 Other specified noninflammatory disorders of vulva and perineum: Secondary | ICD-10-CM | POA: Insufficient documentation

## 2020-08-16 DIAGNOSIS — B372 Candidiasis of skin and nail: Secondary | ICD-10-CM | POA: Insufficient documentation

## 2020-08-16 DIAGNOSIS — M722 Plantar fascial fibromatosis: Secondary | ICD-10-CM

## 2020-08-16 DIAGNOSIS — N83209 Unspecified ovarian cyst, unspecified side: Secondary | ICD-10-CM | POA: Insufficient documentation

## 2020-08-16 MED ORDER — METHYLPREDNISOLONE 4 MG PO TBPK: ORAL_TABLET | ORAL | 0 refills | Status: DC

## 2020-08-16 MED ORDER — TRIAMCINOLONE ACETONIDE 40 MG/ML IJ SUSP
20.0000 mg | Freq: Once | INTRAMUSCULAR | Status: AC
  Administered 2020-08-16: 20 mg

## 2020-08-16 MED ORDER — MELOXICAM 15 MG PO TABS: 15.0000 mg | ORAL_TABLET | Freq: Every day | ORAL | 3 refills | Status: DC

## 2020-08-16 NOTE — Progress Notes (Signed)
Subjective:  Patient ID: Debra Herrera, female    DOB: [birth year],  MRN: [id number] HPI Chief Complaint  Patient presents with  . Foot Pain    Plantar/posterior heel left - sharp, shooting sensations x few months, AM pain, tried soaking and Advil - helped some, also wears good shoes and OTC insoles  . New Patient (Initial Visit)    40 y.o. female presents with the above complaint.   ROS: Denies fever chills nausea vomiting muscle aches pains calf pain back pain chest pain shortness of breath.  Past Medical History:  Diagnosis Date  . Anxiety   . Depression   . Headache(784.0)   . Human papilloma virus   . OCD (obsessive compulsive disorder)   . TMJ arthralgia   . Urinary tract infection    Past Surgical History:  Procedure Laterality Date  . ADENOIDECTOMY    . TONSILLECTOMY AND ADENOIDECTOMY      Current Outpatient Medications:  .  meloxicam (MOBIC) 15 MG tablet, Take 1 tablet (15 mg total) by mouth daily., Disp: 30 tablet, Rfl: 3 .  methylPREDNISolone (MEDROL DOSEPAK) 4 MG TBPK tablet, 6 day dose pack - take as directed, Disp: 21 tablet, Rfl: 0 .  escitalopram (LEXAPRO) 10 MG tablet, Take 1 tablet (10 mg total) by mouth daily., Disp: 30 tablet, Rfl: 2 .  hydrOXYzine (ATARAX/VISTARIL) 10 MG tablet, Take 1 tablet (10 mg total) by mouth 2 (two) times daily as needed. (Patient not taking: Reported on 07/14/2020), Disp: 60 tablet, Rfl: 2 .  levonorgestrel (MIRENA) 20 MCG/24HR IUD, Mirena 20 mcg/24 hours (5 yrs) 52 mg intrauterine device  Take 1 device by intrauterine route. (Patient not taking: Reported on 07/14/2020), Disp: , Rfl:  .  Multiple Vitamin (MULTIVITAMIN) tablet, Take 1 tablet by mouth daily., Disp: , Rfl:   Allergies  Allergen Reactions  . Benadryl [Diphenhydramine Hcl] Rash  . Sulfa Antibiotics Rash   Review of Systems Objective:  There were no vitals filed for this visit.  General: Well developed, nourished, in no acute distress, alert and oriented  x3   Dermatological: Skin is warm, dry and supple bilateral. Nails x 10 are well maintained; remaining integument appears unremarkable at this time. There are no open sores, no preulcerative lesions, no rash or signs of infection present.  Vascular: Dorsalis Pedis artery and Posterior Tibial artery pedal pulses are 2/4 bilateral with immedate capillary fill time. Pedal hair growth present. No varicosities and no lower extremity edema present bilateral.   Neruologic: Grossly intact via light touch bilateral. Vibratory intact via tuning fork bilateral. Protective threshold with Semmes Wienstein monofilament intact to all pedal sites bilateral. Patellar and Achilles deep tendon reflexes 2+ bilateral. No Babinski or clonus noted bilateral.   Musculoskeletal: No gross boney pedal deformities bilateral. No pain, crepitus, or limitation noted with foot and ankle range of motion bilateral. Muscular strength 5/5 in all groups tested bilateral.  Pain on palpation medial calcaneal tubercle of the left heel.  Gait: Unassisted, Nonantalgic.    Radiographs:  Radiographs taken today demonstrate an osseously mature individual plantar distally oriented calcaneal heel spur and soft tissue increase in density at the plantar fascial calcaneal insertion site.  Also has a posterior calcaneal heel spur no thickening of the Achilles at this time.  Assessment & Plan:   Assessment: Planter fasciitis left.  Plan: Discussed etiology pathology conservative versus surgical therapies at this point I injected the left heel today 20 mg Kenalog 5 mg Marcaine point of maximal tenderness.  Start  her on a Medrol Dosepak to be followed by meloxicam.  We discussed appropriate shoe gear stretching exercises ice therapy and shoe gear modifications.  Also placed her in a plantar fascial brace.  She will follow up with me in 1 month if necessary.     Deandra Gadson T. Merr[place], [place]

## 2020-08-16 NOTE — Patient Instructions (Signed)

## 2020-08-17 ENCOUNTER — Ambulatory Visit
Admission: EM | Admit: 2020-08-17 | Discharge: 2020-08-17 | Disposition: A | Discharge status: Home / Self Care | Payer: Self-pay | Attending: Family Medicine | Admitting: Family Medicine

## 2020-08-17 ENCOUNTER — Other Ambulatory Visit: Payer: Self-pay

## 2020-08-17 ENCOUNTER — Ambulatory Visit (INDEPENDENT_AMBULATORY_CARE_PROVIDER_SITE_OTHER): Payer: Self-pay

## 2020-08-17 ENCOUNTER — Telehealth: Payer: Self-pay | Admitting: *Deleted

## 2020-08-17 ENCOUNTER — Encounter: Payer: Self-pay | Admitting: Emergency Medicine

## 2020-08-17 DIAGNOSIS — S6991XA Unspecified injury of right wrist, hand and finger(s), initial encounter: Secondary | ICD-10-CM

## 2020-08-17 DIAGNOSIS — M25531 Pain in right wrist: Secondary | ICD-10-CM

## 2020-08-17 NOTE — Telephone Encounter (Signed)
Tell her not to take medrol as directed.  Take one in the morning and one at night until done with the dosing. Then start the mobic.  If the numbness persists she should go to the ED

## 2020-08-17 NOTE — Telephone Encounter (Signed)
Called and gave instructions per Dr Al Corpus for taking the medication, verbalized understanding and said that her legs seems to be  normal now.

## 2020-08-17 NOTE — ED Triage Notes (Signed)
Patient c/o RT hand injury that started today.   Patient states " my dogs paw got in between my pinky and the finger beside it when the injury occurred"  Patient has pain present to RT hand specifically the "pinky finger and finger next to it".   Patient has taken Tylenol and Ice with no relief of symptoms.

## 2020-08-17 NOTE — Discharge Instructions (Addendum)
Continue Tylenol 500 mg every 4-6 hours as needed for pain.  Continue icing.  Pain should resolve within 1 to 2 days.  Your imaging today was negative for any acute fracture or dislocation.

## 2020-08-17 NOTE — Telephone Encounter (Signed)
Patient is having possible side effects from prescribed medication(Melocicam,Methylprednisolone or injection) after taking  1 day ago. She stated that her legs went numb and she could not drive her car. Please advise.

## 2020-08-17 NOTE — ED Provider Notes (Signed)
[facility]    CSN: [id number] Arrival date & time: 08/17/20  1441      History   Chief Complaint Chief Complaint  Patient presents with  . Hand Injury    HPI Debra Herrera is a 40 y.o. female.   HPI  Patient presents today for evaluation of right hand injury which occurred today. She reports she reports playing with her dog and inadvertently the dog's paw struck her between her fifth and fourth digit.  She immediately experienced pain and bruising.  She has been taking Tylenol and applying ice without relief.  She is concerned with possible fracture.  Past Medical History:  Diagnosis Date  . Anxiety   . Depression   . Headache(784.0)   . Human papilloma virus   . OCD (obsessive compulsive disorder)   . TMJ arthralgia   . Urinary tract infection     Patient Active Problem List   Diagnosis Date Noted  . Candidiasis of skin 08/16/2020  . Cyst of ovary 08/16/2020  . Lesion of vulva 08/16/2020  . Borderline personality disorder (HCC) 09/18/2018  . Major depressive disorder, recurrent episode (HCC) 09/18/2018  . Reaction to severe stress, unspecified 09/18/2018  . Generalized anxiety disorder 09/18/2018  . Amenorrhea, secondary 02/07/2011  . DEPRESSIVE DISORDER NOT ELSEWHERE CLASSIFIED 10/12/2008    Past Surgical History:  Procedure Laterality Date  . ADENOIDECTOMY    . TONSILLECTOMY AND ADENOIDECTOMY      OB History    Gravida  2   Para  2   Term  2   Preterm  0   AB  0   Living  2     SAB  0   IAB  0   Ectopic  0   Multiple      Live Births  2            Home Medications    Prior to Admission medications   Medication Sig Start Date End Date Taking? Authorizing Provider  escitalopram (LEXAPRO) 10 MG tablet Take 1 tablet (10 mg total) by mouth daily. 05/25/20 08/23/20  Shanna Cisco, NP  hydrOXYzine (ATARAX/VISTARIL) 10 MG tablet Take 1 tablet (10 mg total) by mouth 2 (two) times daily as needed. Patient not  taking: No sig reported 05/25/20   Shanna Cisco, NP  levonorgestrel (MIRENA) 20 MCG/24HR IUD Mirena 20 mcg/24 hours (5 yrs) 52 mg intrauterine device  Take 1 device by intrauterine route. Patient not taking: No sig reported    [provider]  meloxicam (MOBIC) 15 MG tablet Take 1 tablet (15 mg total) by mouth daily. 08/16/20   Hyatt, Max T, DPM  methylPREDNISolone (MEDROL DOSEPAK) 4 MG TBPK tablet 6 day dose pack - take as directed 08/16/20   Hyatt, Max T, DPM  Multiple Vitamin (MULTIVITAMIN) tablet Take 1 tablet by mouth daily.    [provider]    Family History Family History  Problem Relation Age of Onset  . Diabetes Father   . Hypertension Father   . Hypertension Mother   . Other Neg Hx     Social History Social History   Tobacco Use  . Smoking status: Never Smoker  . Smokeless tobacco: Never Used  Vaping Use  . Vaping Use: Never used  Substance Use Topics  . Alcohol use: No  . Drug use: No     Allergies   Benadryl [diphenhydramine hcl] and Sulfa antibiotics   Review of Systems Review of Systems Pertinent negatives listed  in HPI   Physical Exam Triage Vital Signs ED Triage Vitals  Enc Vitals Group     BP 08/17/20 1457 115/81     Pulse Rate 08/17/20 1457 88     Resp 08/17/20 1457 12     Temp 08/17/20 1457 98.4 F (36.9 C)     Temp Source 08/17/20 1457 Oral     SpO2 08/17/20 1457 95 %     Weight --      Height --      Head Circumference --      Peak Flow --      Pain Score 08/17/20 1455 10     Pain Loc --      Pain Edu? --      Excl. in GC? --    No data found.  Updated Vital Signs BP 115/81 (BP Location: Left Arm)   Pulse 88   Temp 98.4 F (36.9 C) (Oral)   Resp 12   LMP 08/17/2020   SpO2 95%   Visual Acuity Right Eye Distance:   Left Eye Distance:   Bilateral Distance:    Right Eye Near:   Left Eye Near:    Bilateral Near:     Physical Exam General appearance: alert, well developed, well nourished,  cooperative Head: Normocephalic, without obvious abnormality, atraumatic Respiratory: Respirations even and unlabored, normal respiratory rate Heart: Rate and rhythm normal. No gallop or murmurs noted on exam  Extremities: Right hand no visible deformity, ecchymosis distal digits 4 and 5, ROM intact digit x 5 right hand  Skin: Skin color, texture, turgor normal. No rashes seen  Psych: Appropriate mood and affect.  UC Treatments / Results  Labs (all labs ordered are listed, but only abnormal results are displayed) Labs Reviewed - No data to display  EKG   Radiology DG Hand Complete Right  Result Date: 08/17/2020 CLINICAL DATA:  Hand injury. EXAM: RIGHT HAND - COMPLETE 3+ VIEW COMPARISON:  Right wrist 08/31/2017. FINDINGS: There is no evidence of fracture or dislocation. There is no evidence of arthropathy or other focal bone abnormality. Soft tissues are unremarkable. IMPRESSION: No acute abnormality. Electronically Signed   By: Maisie Fus  Register   On: 08/17/2020 15:36   DG Foot Complete Left  Result Date: 08/16/2020 Please see detailed radiograph report in office note.   Procedures Procedures (including critical care time)  Medications Ordered in UC Medications - No data to display  Initial Impression / Assessment and Plan / UC Course  I have reviewed the triage vital signs and the nursing notes.  Pertinent labs & imaging results that were available during my care of the patient were reviewed by me and considered in my medical decision making (see chart for details).    Right hand injury no acute fracture.  Conservative management indicated only.  Tylenol 500 mg every 4-6 hours as needed.  Continue icing and elevating.  Pain should resolve within the next couple days.  If no improvement follow-up with hand specialty.  Final Clinical Impressions(s) / UC Diagnoses   Final diagnoses:  Injury of finger of right hand, initial encounter     Discharge Instructions     Continue  Tylenol 500 mg every 4-6 hours as needed for pain.  Continue icing.  Pain should resolve within 1 to 2 days.  Your imaging today was negative for any acute fracture or dislocation.    ED Prescriptions    None     PDMP not reviewed this encounter.   Joaquin Courts  S, FNP 08/20/20 1247

## 2020-08-23 ENCOUNTER — Other Ambulatory Visit: Payer: Self-pay

## 2020-08-23 ENCOUNTER — Ambulatory Visit (INDEPENDENT_AMBULATORY_CARE_PROVIDER_SITE_OTHER): Payer: No Payment, Other | Admitting: Psychiatry

## 2020-08-23 ENCOUNTER — Encounter (HOSPITAL_COMMUNITY): Payer: Self-pay | Admitting: Psychiatry

## 2020-08-23 DIAGNOSIS — F3341 Major depressive disorder, recurrent, in partial remission: Secondary | ICD-10-CM | POA: Diagnosis not present

## 2020-08-23 MED ORDER — HYDROXYZINE HCL 10 MG PO TABS: 10.0000 mg | ORAL_TABLET | Freq: Two times a day (BID) | ORAL | 2 refills | Status: DC | PRN

## 2020-08-23 MED ORDER — ESCITALOPRAM OXALATE 10 MG PO TABS: 10.0000 mg | ORAL_TABLET | Freq: Every day | ORAL | 1 refills | Status: DC

## 2020-08-23 NOTE — Progress Notes (Signed)
BH MD/PA/NP OP Progress Note  08/23/2020 12:09 PM Orabelle Rylee  MRN:  [id number]  Chief Complaint: "My stress is with my 38 year old." Chief Complaint    Medication Management       HPI: 40 year old female seen for follow up psychiatric evaluation. She has a psychiatric history of anxiety, OCD, and depression. She is currently prescribed Lexapro 10 mg daily and hydroxyzine 10 mg twice daily for anxiety.  Today she noted that current medication regimen is effective in managing her psychiatric conditions.  Today she is well groomed, pleasant, cooperative, engaged in conversation, and maintained eye contact. She informed provider that since her last visit things have been going well.  She notes that her biggest stress is her 38 year old who she notes is disrespectful and mean at times.  She reports that although she is anxious and depressed at times she finds her Lexapro effective in managing her psychiatric conditions.  Today provider conducted a GAD-7 and patient scored a 16, at her last visit she scored a 7.  Provider also conducted a PHQ-9 and patient scored a 6, at her last visit she scored 12.  She endorses adequate sleep and appetite.  She denies SI/HI/VAH or paranoia.  Patient informed provider that she started a new job at [employer] and  informed provider that she finds enjoyment in her job.  She notes that she is able to get out of the house and feels more productive.  She does inform provider that the job caused her to walk a lot which exacerbate the pain she has in her left foot.  Patient notes that she was prescribed Mobic today to help with the pain.  Provider informed patient that Mobic and Lexapro can cause GI issues.  She endorsed understanding.  No medication changes made today.  Patient agreeable to continue medication as prescribed.  She will follow-up with provider in 3 months for further evaluation.  No other concerns at this time.    Visit Diagnosis:    ICD-10-CM    1. Recurrent major depressive disorder, in partial remission (HCC)  F33.41 escitalopram (LEXAPRO) 10 MG tablet    hydrOXYzine (ATARAX/VISTARIL) 10 MG tablet    Past Psychiatric History: Anxiety, OCD, and depression  Past Medical History:  Past Medical History:  Diagnosis Date  . Anxiety   . Depression   . Headache(784.0)   . Human papilloma virus   . OCD (obsessive compulsive disorder)   . TMJ arthralgia   . Urinary tract infection     Past Surgical History:  Procedure Laterality Date  . ADENOIDECTOMY    . TONSILLECTOMY AND ADENOIDECTOMY      Family Psychiatric History: Brother Bipolar disorder, Mother OCD  Family History:  Family History  Problem Relation Age of Onset  . Diabetes Father   . Hypertension Father   . Hypertension Mother   . Other Neg Hx     Social History:  Social History   Socioeconomic History  . Marital status: Married    Spouse name: Not on file  . Number of children: Not on file  . Years of education: Not on file  . Highest education level: Not on file  Occupational History  . Not on file  Tobacco Use  . Smoking status: Never Smoker  . Smokeless tobacco: Never Used  Vaping Use  . Vaping Use: Never used  Substance and Sexual Activity  . Alcohol use: No  . Drug use: No  . Sexual activity: Yes    Partners: Male  Other Topics Concern  . Not on file  Social History Narrative   ** Merged History Encounter **       Social Determinants of Health   Financial Resource Strain: Not on file  Food Insecurity: Not on file  Transportation Needs: Not on file  Physical Activity: Not on file  Stress: Not on file  Social Connections: Not on file    Allergies:  Allergies  Allergen Reactions  . Benadryl [Diphenhydramine Hcl] Rash  . Sulfa Antibiotics Rash    Metabolic Disorder Labs: No results found for: HGBA1C, MPG No results found for: PROLACTIN No results found for: CHOL, TRIG, HDL, CHOLHDL, VLDL, LDLCALC No results found for:  TSH  Therapeutic Level Labs: No results found for: LITHIUM No results found for: VALPROATE No components found for:  CBMZ  Current Medications: Current Outpatient Medications  Medication Sig Dispense Refill  . escitalopram (LEXAPRO) 10 MG tablet Take 1 tablet (10 mg total) by mouth daily. 90 tablet 1  . hydrOXYzine (ATARAX/VISTARIL) 10 MG tablet Take 1 tablet (10 mg total) by mouth 2 (two) times daily as needed. 60 tablet 2  . levonorgestrel (MIRENA) 20 MCG/24HR IUD Mirena 20 mcg/24 hours (5 yrs) 52 mg intrauterine device  Take 1 device by intrauterine route. (Patient not taking: No sig reported)    . meloxicam (MOBIC) 15 MG tablet Take 1 tablet (15 mg total) by mouth daily. 30 tablet 3  . methylPREDNISolone (MEDROL DOSEPAK) 4 MG TBPK tablet 6 day dose pack - take as directed 21 tablet 0  . Multiple Vitamin (MULTIVITAMIN) tablet Take 1 tablet by mouth daily.     No current facility-administered medications for this visit.     Musculoskeletal: Strength & Muscle Tone: within normal limits Gait & Station: normal Patient leans: N/A  Psychiatric Specialty Exam: Review of Systems  Blood pressure 132/90, pulse 91, height 5\' 3"  (1.6 m), weight 201 lb 9.6 oz (91.4 kg), last menstrual period 08/17/2020, SpO2 100 %.Body mass index is 35.71 kg/m.  General Appearance: Well Groomed  Eye Contact:  Good  Speech:  Clear and Coherent and Normal Rate  Volume:  Normal  Mood:  Euthymic and Informed provider that she is anxious and depressed some days due to life stressors  Affect:  Appropriate and Congruent  Thought Process:  Coherent, Goal Directed and Linear  Orientation:  Full (Time, Place, and Person)  Thought Content: WDL and Logical   Suicidal Thoughts:  No  Homicidal Thoughts:  No  Memory:  Immediate;   Good Recent;   Good Remote;   Good  Judgement:  Good  Insight:  Good  Psychomotor Activity:  Normal  Concentration:  Concentration: Good and Attention Span: Good  Recall:  Good   Fund of Knowledge: Good  Language: Good  Akathisia:  No  Handed:  Right  AIMS (if indicated): Not done  Assets:  Communication Skills Desire for Improvement Financial Resources/Insurance Housing Social Support  ADL's:  Intact  Cognition: WNL  Sleep:  Good   Screenings: GAD-7   Flowsheet Row Clinical Support from 08/23/2020 in [facility] Clinical Support from 05/25/2020 in [facility]  Total GAD-7 Score 16 7    PHQ2-9   Flowsheet Row Clinical Support from 08/23/2020 in [facility] Clinical Support from 05/25/2020 in [facility]  PHQ-2 Total Score 2 5  PHQ-9 Total Score 6 12    Flowsheet Row Clinical Support [facility] 08/23/2020 in [facility] from 08/17/2020 in [facility]   C-SSRS RISK CATEGORY No Risk No Risk       Assessment and Plan: Patient endorses occasional anxiety and depression due to life stressors.  She however noted that her medications are effective in managing her psychiatric conditions.  No medication changes made today.  Patient agreeable to continue all medications as prescribed for  1. Recurrent major depressive disorder, in partial remission (HCC)  Continue- escitalopram (LEXAPRO) 10 MG tablet; Take 1 tablet (10 mg total) by mouth daily.  Dispense: 30 tablet; Refill: 2 Continue- hydrOXYzine (ATARAX/VISTARIL) 10 MG tablet; Take 1 tablet (10 mg total) by mouth 2 (two) times daily as needed.  Dispense: 60 tablet; Refill: 2  Follow up in 3 months.    Shanna Cisco, NP 08/23/2020, 12:09 PM

## 2020-09-20 ENCOUNTER — Ambulatory Visit: Payer: Self-pay | Admitting: Podiatry

## 2020-09-20 ENCOUNTER — Telehealth: Payer: Self-pay | Admitting: *Deleted

## 2020-09-20 NOTE — Telephone Encounter (Signed)
Patient is calling for a refill on Meloxicam 15mg .left Vmessage that she should have 3 refills remaining per epic notes and to check with her pharmacy.

## 2020-11-16 ENCOUNTER — Encounter (HOSPITAL_COMMUNITY): Payer: Self-pay | Admitting: Psychiatry

## 2020-11-16 ENCOUNTER — Ambulatory Visit (INDEPENDENT_AMBULATORY_CARE_PROVIDER_SITE_OTHER): Payer: No Payment, Other | Admitting: Psychiatry

## 2020-11-16 ENCOUNTER — Other Ambulatory Visit: Payer: Self-pay

## 2020-11-16 VITALS — BP 136/87 | HR 76 | Ht 63.0 in | Wt 207.0 lb

## 2020-11-16 DIAGNOSIS — F3341 Major depressive disorder, recurrent, in partial remission: Secondary | ICD-10-CM

## 2020-11-16 DIAGNOSIS — F4321 Adjustment disorder with depressed mood: Secondary | ICD-10-CM

## 2020-11-16 MED ORDER — ESCITALOPRAM OXALATE 10 MG PO TABS: 10.0000 mg | ORAL_TABLET | Freq: Every day | ORAL | 1 refills | Status: DC

## 2020-11-16 MED ORDER — HYDROXYZINE HCL 10 MG PO TABS: 10.0000 mg | ORAL_TABLET | Freq: Two times a day (BID) | ORAL | 2 refills | Status: DC | PRN

## 2020-11-16 NOTE — Progress Notes (Signed)
BH MD/PA/NP OP Progress Note  11/16/2020 11:41 AM Debra Herrera  MRN:  [id number]  Chief Complaint: "My father died."    HPI: 40 year old female seen for follow up psychiatric evaluation. She has a psychiatric history of anxiety, OCD, and depression. She is currently prescribed Lexapro 10 mg daily and hydroxyzine 10 mg twice daily for anxiety.  Today she noted that current medication regimen is effective in managing her psychiatric conditions.  Today she is well groomed, pleasant, cooperative, tearful at times, engaged in conversation, and maintained eye contact. She informed provider that her father passed away in Sep 07, 2022.  She notes that she has been grieving however notes that she is on the upper end of things.  She informed Clinical research associate that her father was a Naval architect and notes that he contracted COVID-19 while in [place].  She informed Clinical research associate that she called a cab to take him to the hospital however notes that he never got better.  She did Archivist that she visited her father prior to him passing and reports that he joked with her which made her feel happy.  She notes that when her father passed away she was very angry at him and God.  She notes that so many questions were left  unanswered however reports that she feels that he answers them for as she still feels his presence.  Patient notes that she finds support in her friends, family, and animals.  Today she denies SI/HI/VAH, mania, or paranoia.    Patient informed Clinical research associate that she recently got a new job at [employer] and is enjoying the benefits.  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  Patient informed writer that she continues to have pain in her foot however notes that Mobic is somewhat effective in managing her pain.  No medication changes made today.  Patient agreeable to continue medication as prescribed.  She will follow-up with provider in 3 months for further evaluation.  No other concerns at this time.    Visit  Diagnosis:    ICD-10-CM   1. Grief  F43.21   2. Recurrent major depressive disorder, in partial remission (HCC)  F33.41 escitalopram (LEXAPRO) 10 MG tablet    hydrOXYzine (ATARAX/VISTARIL) 10 MG tablet    Past Psychiatric History: Anxiety, OCD, and depression  Past Medical History:  Past Medical History:  Diagnosis Date  . Anxiety   . Depression   . Headache(784.0)   . Human papilloma virus   . OCD (obsessive compulsive disorder)   . TMJ arthralgia   . Urinary tract infection     Past Surgical History:  Procedure Laterality Date  . ADENOIDECTOMY    . TONSILLECTOMY AND ADENOIDECTOMY      Family Psychiatric History: Brother Bipolar disorder, Mother OCD  Family History:  Family History  Problem Relation Age of Onset  . Diabetes Father   . Hypertension Father   . Hypertension Mother   . Other Neg Hx     Social History:  Social History   Socioeconomic History  . Marital status: Married    Spouse name: Not on file  . Number of children: Not on file  . Years of education: Not on file  . Highest education level: Not on file  Occupational History  . Not on file  Tobacco Use  . Smoking status: Never Smoker  . Smokeless tobacco: Never Used  Vaping Use  . Vaping Use: Never used  Substance and Sexual Activity  . Alcohol use: No  .  Drug use: No  . Sexual activity: Yes    Partners: Male  Other Topics Concern  . Not on file  Social History Narrative   ** Merged History Encounter **       Social Determinants of Health   Financial Resource Strain: Not on file  Food Insecurity: Not on file  Transportation Needs: Not on file  Physical Activity: Not on file  Stress: Not on file  Social Connections: Not on file    Allergies:  Allergies  Allergen Reactions  . Benadryl [Diphenhydramine Hcl] Rash  . Sulfa Antibiotics Rash    Metabolic Disorder Labs: No results found for: HGBA1C, MPG No results found for: PROLACTIN No results found for: CHOL, TRIG, HDL,  CHOLHDL, VLDL, LDLCALC No results found for: TSH  Therapeutic Level Labs: No results found for: LITHIUM No results found for: VALPROATE No components found for:  CBMZ  Current Medications: Current Outpatient Medications  Medication Sig Dispense Refill  . escitalopram (LEXAPRO) 10 MG tablet Take 1 tablet (10 mg total) by mouth daily. 90 tablet 1  . hydrOXYzine (ATARAX/VISTARIL) 10 MG tablet Take 1 tablet (10 mg total) by mouth 2 (two) times daily as needed. 60 tablet 2  . levonorgestrel (MIRENA) 20 MCG/24HR IUD Mirena 20 mcg/24 hours (5 yrs) 52 mg intrauterine device  Take 1 device by intrauterine route. (Patient not taking: No sig reported)    . meloxicam (MOBIC) 15 MG tablet Take 1 tablet (15 mg total) by mouth daily. 30 tablet 3  . methylPREDNISolone (MEDROL DOSEPAK) 4 MG TBPK tablet 6 day dose pack - take as directed 21 tablet 0  . Multiple Vitamin (MULTIVITAMIN) tablet Take 1 tablet by mouth daily.     No current facility-administered medications for this visit.     Musculoskeletal: Strength & Muscle Tone: within normal limits Gait & Station: normal Patient leans: N/A  Psychiatric Specialty Exam: Review of Systems  There were no vitals taken for this visit.There is no height or weight on file to calculate BMI.  General Appearance: Well Groomed  Eye Contact:  Good  Speech:  Clear and Coherent and Normal Rate  Volume:  Normal  Mood:  Euthymic and Informed provider that she is anxious and depressed due to the loss of her father  Affect:  Appropriate and Congruent  Thought Process:  Coherent, Goal Directed and Linear  Orientation:  Full (Time, Place, and Person)  Thought Content: WDL and Logical   Suicidal Thoughts:  No  Homicidal Thoughts:  No  Memory:  Immediate;   Good Recent;   Good Remote;   Good  Judgement:  Good  Insight:  Good  Psychomotor Activity:  Normal  Concentration:  Concentration: Good and Attention Span: Good  Recall:  Good  Fund of Knowledge: Good   Language: Good  Akathisia:  No  Handed:  Right  AIMS (if indicated): Not done  Assets:  Communication Skills Desire for Improvement Financial Resources/Insurance Housing Social Support  ADL's:  Intact  Cognition: WNL  Sleep:  Good   Screenings: GAD-7   Flowsheet Row Clinical Support from 08/23/2020 in [facility] Clinical Support from 05/25/2020 in [facility]  Total GAD-7 Score 16 7    PHQ2-9   Flowsheet Row Clinical Support from 08/23/2020 in [facility] Clinical Support from 05/25/2020 in [facility]  PHQ-2 Total Score 2 5  PHQ-9 Total Score 6 12    Flowsheet Row Clinical Support from 08/23/2020  in [facility] ED from 08/17/2020 in [facility]   C-SSRS RISK CATEGORY No Risk No Risk       Assessment and Plan: Patient notes that her medications are effective in managing her psychiatric conditions however notes that she grieves the loss of her father.  No medication changes made today.  Patient agreeable to continue medication as scribed.  1. Recurrent major depressive disorder, in partial remission (HCC)  Continue- escitalopram (LEXAPRO) 10 MG tablet; Take 1 tablet (10 mg total) by mouth daily.  Dispense: 90 tablet; Refill: 1 Continue- hydrOXYzine (ATARAX/VISTARIL) 10 MG tablet; Take 1 tablet (10 mg total) by mouth 2 (two) times daily as needed.  Dispense: 60 tablet; Refill: 2  2. Grief     Follow up in 3 months.    Shanna Cisco, NP 11/16/2020, 11:41 AM

## 2020-11-25 ENCOUNTER — Other Ambulatory Visit: Payer: Self-pay | Admitting: Podiatry

## 2021-01-25 ENCOUNTER — Encounter (HOSPITAL_COMMUNITY): Payer: Self-pay | Admitting: Psychiatry

## 2021-01-25 ENCOUNTER — Other Ambulatory Visit: Payer: Self-pay

## 2021-01-25 ENCOUNTER — Telehealth (INDEPENDENT_AMBULATORY_CARE_PROVIDER_SITE_OTHER): Payer: No Payment, Other | Admitting: Psychiatry

## 2021-01-25 DIAGNOSIS — F3341 Major depressive disorder, recurrent, in partial remission: Secondary | ICD-10-CM | POA: Diagnosis not present

## 2021-01-25 MED ORDER — HYDROXYZINE HCL 10 MG PO TABS: 10.0000 mg | ORAL_TABLET | Freq: Two times a day (BID) | ORAL | 2 refills | Status: DC | PRN

## 2021-01-25 MED ORDER — ESCITALOPRAM OXALATE 10 MG PO TABS: 10.0000 mg | ORAL_TABLET | Freq: Every day | ORAL | 2 refills | Status: DC
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
## 2021-04-27 ENCOUNTER — Telehealth (INDEPENDENT_AMBULATORY_CARE_PROVIDER_SITE_OTHER): Payer: No Payment, Other | Admitting: Psychiatry

## 2021-04-27 ENCOUNTER — Encounter (HOSPITAL_COMMUNITY): Payer: Self-pay | Admitting: Psychiatry

## 2021-04-27 DIAGNOSIS — F3341 Major depressive disorder, recurrent, in partial remission: Secondary | ICD-10-CM

## 2021-04-27 MED ORDER — ESCITALOPRAM OXALATE 10 MG PO TABS: 10.0000 mg | ORAL_TABLET | Freq: Every day | ORAL | 3 refills | Status: DC
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
## 2021-05-10 ENCOUNTER — Other Ambulatory Visit: Payer: Self-pay | Admitting: Podiatry

## 2021-05-10 NOTE — Telephone Encounter (Signed)
Please advise 

## 2021-07-20 ENCOUNTER — Encounter (HOSPITAL_COMMUNITY): Payer: Self-pay | Admitting: Psychiatry

## 2021-07-20 ENCOUNTER — Other Ambulatory Visit: Payer: Self-pay

## 2021-07-20 ENCOUNTER — Telehealth (INDEPENDENT_AMBULATORY_CARE_PROVIDER_SITE_OTHER): Payer: No Payment, Other | Admitting: Psychiatry

## 2021-07-20 DIAGNOSIS — F4321 Adjustment disorder with depressed mood: Secondary | ICD-10-CM

## 2021-07-20 DIAGNOSIS — F411 Generalized anxiety disorder: Secondary | ICD-10-CM | POA: Diagnosis not present

## 2021-07-20 DIAGNOSIS — F3341 Major depressive disorder, recurrent, in partial remission: Secondary | ICD-10-CM | POA: Diagnosis not present

## 2021-07-20 MED ORDER — HYDROXYZINE HCL 10 MG PO TABS: 10.0000 mg | ORAL_TABLET | Freq: Two times a day (BID) | ORAL | 3 refills | Status: DC | PRN

## 2021-07-20 MED ORDER — ESCITALOPRAM OXALATE 10 MG PO TABS: 10.0000 mg | ORAL_TABLET | Freq: Every day | ORAL | 3 refills | Status: DC

## 2021-07-20 NOTE — Progress Notes (Signed)
BH MD/PA/NP OP Progress Note Virtual Visit via Video Note  I connected with Debra CrateStephanie Danielle Gahm on 07/20/21 at  9:30 AM EST by a video enabled telemedicine application and verified that I am speaking with the correct person using two identifiers.  Location: Patient: Home Provider: Clinic   I discussed the limitations of evaluation and management by telemedicine and the availability of in person appointments. The patient expressed understanding and agreed to proceed.  I provided 30 minutes of non-face-to-face time during this encounter.       07/20/2021 9:28 AM Debra Herrera  MRN:  [id number]  Chief Complaint:  "Depression hit pretty hard after the holidays"    HPI: 41 year old female seen for follow up psychiatric evaluation. She has a psychiatric history of anxiety, OCD, and depression. She is currently prescribed Lexapro 10 mg daily and hydroxyzine 10 mg twice daily for anxiety.  Today she noted that current medication regimen is effective in managing her psychiatric conditions.  Today she is well groomed, pleasant, cooperative, engaged in conversation, and maintaining eye contact. She informed Clinical research associatewriter that depression hit her hard after the holidays. She notes that it was a close to a year after her father died. She notes that she has been reliving his last days in the hospital and unable to look at his picture. She notes that she tries to cope by cleaning, working (at [employer]), and watching TV. She reports that he mother is encouraging her live life as this is what her father would have wanted. She notes that her children are also a source of stress as they don't do chores but reports that she is able to cope with it.   Despite the stressors patient reports that she is able to cope.  Today provider conducted a GAD-7 and patient scored a 6, at her last visit she scored a 10.  Provider also conducted a PHQ-9 and patient scored a 10, at there last visit she scored a 8.   She endorsed adequate sleep (8 hours but notes at times it is disturbed) and appetite.   Today she denies SI/HI/VAH, mania, or paranoia.    No medication changes made today.  Patient agreeable to continue medication as prescribed.  No other concerns at this time.    Visit Diagnosis:    ICD-10-CM   1. Grief  F43.21     2. Recurrent major depressive disorder, in partial remission (HCC)  F33.41 escitalopram (LEXAPRO) 10 MG tablet    3. Generalized anxiety disorder  F41.1 hydrOXYzine (ATARAX) 10 MG tablet      Past Psychiatric History: Anxiety, OCD, and depression  Past Medical History:  Past Medical History:  Diagnosis Date   Anxiety    Depression    Headache(784.0)    Human papilloma virus    OCD (obsessive compulsive disorder)    TMJ arthralgia    Urinary tract infection     Past Surgical History:  Procedure Laterality Date   ADENOIDECTOMY     TONSILLECTOMY AND ADENOIDECTOMY      Family Psychiatric History: Brother Bipolar disorder, Mother OCD  Family History:  Family History  Problem Relation Age of Onset   Diabetes Father    Hypertension Father    Hypertension Mother    Other Neg Hx     Social History:  Social History   Socioeconomic History   Marital status: Married    Spouse name: Not on file   Number of children: Not on file   Years of education:  Not on file   Highest education level: Not on file  Occupational History   Not on file  Tobacco Use   Smoking status: Never   Smokeless tobacco: Never  Vaping Use   Vaping Use: Never used  Substance and Sexual Activity   Alcohol use: No   Drug use: No   Sexual activity: Yes    Partners: Male  Other Topics Concern   Not on file  Social History Narrative   ** Merged History Encounter **       Social Determinants of Health   Financial Resource Strain: Not on file  Food Insecurity: Not on file  Transportation Needs: Not on file  Physical Activity: Not on file  Stress: Not on file  Social  Connections: Not on file    Allergies:  Allergies  Allergen Reactions   Benadryl [Diphenhydramine Hcl] Rash   Sulfa Antibiotics Rash    Metabolic Disorder Labs: No results found for: HGBA1C, MPG No results found for: PROLACTIN No results found for: CHOL, TRIG, HDL, CHOLHDL, VLDL, LDLCALC No results found for: TSH  Therapeutic Level Labs: No results found for: LITHIUM No results found for: VALPROATE No components found for:  CBMZ  Current Medications: Current Outpatient Medications  Medication Sig Dispense Refill   escitalopram (LEXAPRO) 10 MG tablet Take 1 tablet (10 mg total) by mouth daily. 90 tablet 3   hydrOXYzine (ATARAX) 10 MG tablet Take 1 tablet (10 mg total) by mouth 2 (two) times daily as needed. 60 tablet 3   levonorgestrel (MIRENA) 20 MCG/24HR IUD Mirena 20 mcg/24 hours (5 yrs) 52 mg intrauterine device  Take 1 device by intrauterine route.     meloxicam (MOBIC) 15 MG tablet TAKE 1 TABLET (15 MG TOTAL) BY MOUTH DAILY. 30 tablet 3   methylPREDNISolone (MEDROL DOSEPAK) 4 MG TBPK tablet 6 day dose pack - take as directed 21 tablet 0   Multiple Vitamin (MULTIVITAMIN) tablet Take 1 tablet by mouth daily.     No current facility-administered medications for this visit.     Musculoskeletal: Strength & Muscle Tone:  Unable to assess due to telehealth visit Gait & Station:  Unable to assess due to telehealth visit Patient leans: N/A  Psychiatric Specialty Exam: Review of Systems  There were no vitals taken for this visit.There is no height or weight on file to calculate BMI.  General Appearance: Well Groomed  Eye Contact:  Good  Speech:  Clear and Coherent and Normal Rate  Volume:  Normal  Mood:  Euthymic and Informed provider that she is anxious and depressed at times but Is able to cope with it  Affect:  Appropriate and Congruent  Thought Process:  Coherent, Goal Directed and Linear  Orientation:  Full (Time, Place, and Person)  Thought Content: WDL and  Logical   Suicidal Thoughts:  No  Homicidal Thoughts:  No  Memory:  Immediate;   Good Recent;   Good Remote;   Good  Judgement:  Good  Insight:  Good  Psychomotor Activity:  Normal  Concentration:  Concentration: Good and Attention Span: Good  Recall:  Good  Fund of Knowledge: Good  Language: Good  Akathisia:  No  Handed:  Right  AIMS (if indicated): Not done  Assets:  Communication Skills Desire for Improvement Financial Resources/Insurance Housing Social Support  ADL's:  Intact  Cognition: WNL  Sleep:  Good   Screenings: GAD-7    Flowsheet Row Video Visit from 07/20/2021 in [facility] Video Visit from  04/27/2021 in [facility] Video Visit from 01/25/2021 in [facility] Clinical Support from 08/23/2020 in [facility] Clinical Support from 05/25/2020 in [facility]  Total GAD-7 Score 6 10 16 16 7       PHQ2-9    Flowsheet Row Video Visit from 07/20/2021 in [facility] Video Visit from 04/27/2021 in [facility] Video Visit from 01/25/2021 in [facility] Clinical Support from 08/23/2020 in [facility] Clinical Support from 05/25/2020 in [facility]  PHQ-2 Total Score 5 0 1 2 5   PHQ-9 Total Score 10 8 9 6 12       Flowsheet Row Clinical Support from 08/23/2020 in [facility] ED from 08/17/2020 in [facility]   C-SSRS RISK CATEGORY No Risk No Risk        Assessment and Plan: Patient notes that she anxiety and depression due to life stressors however she is able to cope with it.  No medication changes made today.  Patient agreeable to continue medication as prescribed.   1. Recurrent major depressive disorder, in partial remission  (HCC)  Continue- escitalopram (LEXAPRO) 10 MG tablet; Take 1 tablet (10 mg total) by mouth daily.  Dispense: 90 tablet; Refill: 3  2. Grief   3. Generalized anxiety disorder Continue- hydrOXYzine (ATARAX) 10 MG tablet; Take 1 tablet (10 mg total) by mouth 2 (two) times daily as needed.  Dispense: 60 tablet; Refill: 3      Follow up in 3 months.    10/17/2020, NP 07/20/2021, 9:28 AM

## 2021-09-14 ENCOUNTER — Other Ambulatory Visit: Payer: Self-pay | Admitting: Podiatry

## 2021-09-18 ENCOUNTER — Emergency Department (HOSPITAL_COMMUNITY): Payer: Self-pay

## 2021-09-18 ENCOUNTER — Emergency Department (HOSPITAL_COMMUNITY)
Admission: EM | Admit: 2021-09-18 | Discharge: 2021-09-19 | Disposition: A | Discharge status: Home / Self Care | Payer: Self-pay | Attending: Emergency Medicine | Admitting: Emergency Medicine

## 2021-09-18 DIAGNOSIS — H538 Other visual disturbances: Secondary | ICD-10-CM | POA: Insufficient documentation

## 2021-09-18 DIAGNOSIS — N9489 Other specified conditions associated with female genital organs and menstrual cycle: Secondary | ICD-10-CM | POA: Insufficient documentation

## 2021-09-18 DIAGNOSIS — G43011 Migraine without aura, intractable, with status migrainosus: Secondary | ICD-10-CM | POA: Insufficient documentation

## 2021-09-18 LAB — CBC WITH DIFFERENTIAL/PLATELET
Abs Immature Granulocytes: 0.03 10*3/uL (ref 0.00–0.07)
Basophils Absolute: 0 10*3/uL (ref 0.0–0.1)
Basophils Relative: 0 %
Eosinophils Absolute: 0.1 10*3/uL (ref 0.0–0.5)
Eosinophils Relative: 2 %
HCT: 39.2 % (ref 36.0–46.0)
Hemoglobin: 12.7 g/dL (ref 12.0–15.0)
Immature Granulocytes: 0 %
Lymphocytes Relative: 32 %
Lymphs Abs: 2.7 10*3/uL (ref 0.7–4.0)
MCH: 29.8 pg (ref 26.0–34.0)
MCHC: 32.4 g/dL (ref 30.0–36.0)
MCV: 92 fL (ref 80.0–100.0)
Monocytes Absolute: 0.5 10*3/uL (ref 0.1–1.0)
Monocytes Relative: 6 %
Neutro Abs: 5 10*3/uL (ref 1.7–7.7)
Neutrophils Relative %: 60 %
Platelets: 333 10*3/uL (ref 150–400)
RBC: 4.26 MIL/uL (ref 3.87–5.11)
RDW: 13.1 % (ref 11.5–15.5)
WBC: 8.3 10*3/uL (ref 4.0–10.5)
nRBC: 0 % (ref 0.0–0.2)

## 2021-09-18 LAB — COMPREHENSIVE METABOLIC PANEL
ALT: 20 U/L (ref 0–44)
AST: 20 U/L (ref 15–41)
Albumin: 4 g/dL (ref 3.5–5.0)
Alkaline Phosphatase: 67 U/L (ref 38–126)
Anion gap: 9 (ref 5–15)
BUN: 14 mg/dL (ref 6–20)
CO2: 26 mmol/L (ref 22–32)
Calcium: 9.4 mg/dL (ref 8.9–10.3)
Chloride: 101 mmol/L (ref 98–111)
Creatinine, Ser: 0.8 mg/dL (ref 0.44–1.00)
GFR, Estimated: >60 mL/min (ref >60)
Glucose, Bld: 94 mg/dL (ref 70–99)
Potassium: 4 mmol/L (ref 3.5–5.1)
Sodium: 136 mmol/L (ref 135–145)
Total Bilirubin: 0.2 mg/dL — ABNORMAL LOW (ref 0.3–1.2)
Total Protein: 7.4 g/dL (ref 6.5–8.1)

## 2021-09-18 LAB — VITAMIN B12: Vitamin B-12: 380 pg/mL (ref 180–914)

## 2021-09-18 LAB — HCG, SERUM, QUALITATIVE: Preg, Serum: NEGATIVE

## 2021-09-18 MED ORDER — GADOBUTROL 1 MMOL/ML IV SOLN
9.5000 mL | Freq: Once | INTRAVENOUS | Status: AC | PRN
  Administered 2021-09-18: 9.5 mL via INTRAVENOUS

## 2021-09-18 MED ORDER — PROCHLORPERAZINE EDISYLATE 10 MG/2ML IJ SOLN
10.0000 mg | Freq: Once | INTRAMUSCULAR | Status: AC
  Administered 2021-09-18: 10 mg via INTRAVENOUS
  Filled 2021-09-18: qty 2

## 2021-09-18 MED ORDER — ACETAMINOPHEN 500 MG PO TABS
1000.0000 mg | ORAL_TABLET | Freq: Once | ORAL | Status: AC
  Administered 2021-09-18: 1000 mg via ORAL
  Filled 2021-09-18: qty 2

## 2021-09-18 MED ORDER — LORAZEPAM 1 MG PO TABS: 0.5000 mg | ORAL_TABLET | Freq: Once | ORAL | Status: DC | PRN

## 2021-09-18 NOTE — ED Provider Triage Note (Signed)
Emergency Medicine Provider Triage Evaluation Note ? ?Debra Herrera , a 41 y.o. female  was evaluated in triage.  Pt complains of change in vision today.  Patient reports that she was watching television when she had blurry vision surrounding the center but the center was clear.  Reports she did go a new prescription.  She reports some tingling on her bilateral arms today as well.  She was by urgent care and by the eye doctor.  She was in her by the eye doctor to rule out any optic neuritis.  Reports headache and photophobia. ? ?Review of Systems  ?Positive: See above ?Negative: See above ? ?Physical Exam  ?BP (!) 132/92   Pulse 82   Temp 98.1 ?F (36.7 ?C) (Oral)   Resp 16   SpO2 96%  ?Gen:   Awake, no distress   ?Resp:  Normal effort  ?MSK:   Moves extremities without difficulty  ?Other:  Cranial nerves II through XII intact.  The patient is able to look side to side and up and down.  She reports she is not able to see need to do a EOMI check, however when asking the patient to move "this arm in that arm" by doing a visual representation of myself, she is able to follow visual cues without any direct verbal direction.  She most likely is able to see me. ? ?Medical Decision Making  ?Medically screening exam initiated at 5:41 PM.  Appropriate orders placed.  Calli Bashor was informed that the remainder of the evaluation will be completed by another provider, this initial triage assessment does not replace that evaluation, and the importance of remaining in the ED until their evaluation is complete. ? ?Woke about the case with my attending, Dr. Posey Rea who recommended MRI with and without contrast of the brain and orbits.  Basic lab work placed as well. ?  ?Achille Rich, PA-C ?09/18/21 1743 ? ?

## 2021-09-18 NOTE — ED Provider Notes (Signed)
?[facility] ?Provider Note ? ? ?CSN: [id number] ?Arrival date & time: 09/18/21  1707 ? ?  ? ?History ? ?Chief Complaint  ?Patient presents with  ? Blurred Vision  ? Eye Pain  ? ? ?Debra Herrera is a 41 y.o. female. ? ?Patient sent over from eye doctor in urgent care for blurry vision and to look for signs of optic neuritis.  Patient had gradual worsening headache along with blurry vision and wavy vision that started on 1 PM today.  Symptoms fairly persistent.  No history of similar.  Mild photophobia.  No neck stiffness or fevers or cough.  Patient had mild headaches before but not specifically like this with the wavy vision.  Patient actually had double vision as well.  No history of known MS.  No diabetes history. ? ? ?  ? ?Home Medications ?Prior to Admission medications   ?Medication Sig Start Date End Date Taking? Authorizing Provider  ?escitalopram (LEXAPRO) 10 MG tablet Take 1 tablet (10 mg total) by mouth daily. 07/20/21   Shanna CiscoParsons, Brittney E, NP  ?hydrOXYzine (ATARAX) 10 MG tablet Take 1 tablet (10 mg total) by mouth 2 (two) times daily as needed. 07/20/21   Shanna CiscoParsons, Brittney E, NP  ?levonorgestrel (MIRENA) 20 MCG/24HR IUD Mirena 20 mcg/24 hours (5 yrs) 52 mg intrauterine device ? Take 1 device by intrauterine route.    [provider]  ?meloxicam (MOBIC) 15 MG tablet TAKE 1 TABLET (15 MG TOTAL) BY MOUTH DAILY. 09/18/21   Hyatt, Max T, DPM  ?methylPREDNISolone (MEDROL DOSEPAK) 4 MG TBPK tablet 6 day dose pack - take as directed 08/16/20   Hyatt, Max T, DPM  ?Multiple Vitamin (MULTIVITAMIN) tablet Take 1 tablet by mouth daily.    [provider]  ?   ? ?Allergies    ?Benadryl [diphenhydramine hcl] and Sulfa antibiotics   ? ?Review of Systems   ?Review of Systems  ?Constitutional:  Negative for chills and fever.  ?HENT:  Negative for congestion.   ?Eyes:  Positive for photophobia and visual disturbance.  ?Respiratory:  Negative for shortness of  breath.   ?Cardiovascular:  Negative for chest pain.  ?Gastrointestinal:  Negative for abdominal pain and vomiting.  ?Genitourinary:  Negative for dysuria and flank pain.  ?Musculoskeletal:  Negative for back pain, neck pain and neck stiffness.  ?Skin:  Negative for rash.  ?Neurological:  Positive for headaches. Negative for light-headedness.  ? ?Physical Exam ?Updated Vital Signs ?BP (!) 117/91   Pulse 83   Temp 98.1 ?F (36.7 ?C) (Oral)   Resp 15   SpO2 100%  ?Physical Exam ?Vitals and nursing note reviewed.  ?Constitutional:   ?   General: She is not in acute distress. ?   Appearance: She is well-developed.  ?HENT:  ?   Head: Normocephalic and atraumatic.  ?   Mouth/Throat:  ?   Mouth: Mucous membranes are moist.  ?Eyes:  ?   General:     ?   Right eye: No discharge.     ?   Left eye: No discharge.  ?   Conjunctiva/sclera: Conjunctivae normal.  ?Neck:  ?   Trachea: No tracheal deviation.  ?Cardiovascular:  ?   Rate and Rhythm: Normal rate and regular rhythm.  ?Pulmonary:  ?   Effort: Pulmonary effort is normal.  ?   Breath sounds: Normal breath sounds.  ?Abdominal:  ?   General: There is no distension.  ?Musculoskeletal:  ?   Cervical back: Normal  range of motion and neck supple. No rigidity.  ?Skin: ?   General: Skin is warm.  ?   Capillary Refill: Capillary refill takes less than 2 seconds.  ?   Findings: No rash.  ?Neurological:  ?   Mental Status: She is alert.  ?   Comments: Patient has pupils equal bilateral extraocular muscle function intact, no swelling periorbital.  Patient has blurry vision bilateral difficult to tell 1 versus 2 fingers in all visual fields.  Patient has mild dysmetria due to blurry vision.  Patient has 5+ strength in all extremities bilateral gross sensation intact to palpation upper and lower.  Patient answers questions appropriately normal speech no confusion.  ?Psychiatric:     ?   Mood and Affect: Mood is anxious.  ? ? ?ED Results / Procedures / Treatments   ?Labs ?(all labs  ordered are listed, but only abnormal results are displayed) ?Labs Reviewed  ?COMPREHENSIVE METABOLIC PANEL - Abnormal; Notable for the following components:  ?    Result Value  ? Total Bilirubin 0.2 (*)   ? All other components within normal limits  ?CBC WITH DIFFERENTIAL/PLATELET  ?HCG, SERUM, QUALITATIVE  ?VITAMIN B12  ? ? ?EKG ?None ? ?Radiology ?MR BRAIN W WO CONTRAST ? ?Result Date: 09/18/2021 ?CLINICAL DATA:  Optic neuritis suspected EXAM: MRI HEAD AND ORBITS WITHOUT AND WITH CONTRAST TECHNIQUE: Multiplanar, multiecho pulse sequences of the brain and surrounding structures were obtained without and with intravenous contrast. Multiplanar, multiecho pulse sequences of the orbits and surrounding structures were obtained including fat saturation techniques, before and after intravenous contrast administration. CONTRAST:  9.17mL GADAVIST GADOBUTROL 1 MMOL/ML IV SOLN COMPARISON:  None. FINDINGS: MRI HEAD FINDINGS Brain: No acute infarct, mass effect or extra-axial collection. No acute or chronic hemorrhage. Normal white matter signal, parenchymal volume and CSF spaces. The midline structures are normal. There is no abnormal contrast enhancement. Vascular: Major flow voids are preserved. Skull and upper cervical spine: Normal calvarium and skull base. Visualized upper cervical spine and soft tissues are normal. MRI ORBITS FINDINGS Orbits: No traumatic or inflammatory finding. Globes, optic nerves, orbital fat, extraocular muscles, vascular structures, and lacrimal glands are normal. Visualized sinuses: Clear. Soft tissues: Negative. IMPRESSION: Normal MRI of the brain and orbits. Electronically Signed   By: Deatra Robinson M.D.   On: 09/18/2021 19:26  ? ?MR ORBITS W WO CONTRAST ? ?Result Date: 09/18/2021 ?CLINICAL DATA:  Optic neuritis suspected EXAM: MRI HEAD AND ORBITS WITHOUT AND WITH CONTRAST TECHNIQUE: Multiplanar, multiecho pulse sequences of the brain and surrounding structures were obtained without and with  intravenous contrast. Multiplanar, multiecho pulse sequences of the orbits and surrounding structures were obtained including fat saturation techniques, before and after intravenous contrast administration. CONTRAST:  9.38mL GADAVIST GADOBUTROL 1 MMOL/ML IV SOLN COMPARISON:  None. FINDINGS: MRI HEAD FINDINGS Brain: No acute infarct, mass effect or extra-axial collection. No acute or chronic hemorrhage. Normal white matter signal, parenchymal volume and CSF spaces. The midline structures are normal. There is no abnormal contrast enhancement. Vascular: Major flow voids are preserved. Skull and upper cervical spine: Normal calvarium and skull base. Visualized upper cervical spine and soft tissues are normal. MRI ORBITS FINDINGS Orbits: No traumatic or inflammatory finding. Globes, optic nerves, orbital fat, extraocular muscles, vascular structures, and lacrimal glands are normal. Visualized sinuses: Clear. Soft tissues: Negative. IMPRESSION: Normal MRI of the brain and orbits. Electronically Signed   By: Deatra Robinson M.D.   On: 09/18/2021 19:26   ? ?Procedures ?Procedures  ? ? ?  Medications Ordered in ED ?Medications  ?LORazepam (ATIVAN) tablet 0.5 mg (has no administration in time range)  ?gadobutrol (GADAVIST) 1 MMOL/ML injection 9.5 mL (9.5 mLs Intravenous Contrast Given 09/18/21 1847)  ?acetaminophen (TYLENOL) tablet 1,000 mg (1,000 mg Oral Given 09/18/21 2307)  ?prochlorperazine (COMPAZINE) injection 10 mg (10 mg Intravenous Given 09/18/21 2308)  ? ? ?ED Course/ Medical Decision Making/ A&P ?  ?                        ?Medical Decision Making ?Risk ?OTC drugs. ?Prescription drug management. ? ? ?Patient sent in from urgent care and eye doctor for new onset blurry vision and headache.  Differential includes migraine, MS, venous thrombosis, intracranial bleeding, other type of headache, ocular related.  Patient is overall well-appearing, no focal neuro deficits, blurry vision to fingers. ?Patient's husband arrived and we  discussed concerns and results of MRI which fortunately did not show any acute abnormalities.  Blood work reviewed normal white count normal hemoglobin electrolytes unremarkable pregnancy test negative.  Patient agreed to

## 2021-09-18 NOTE — ED Triage Notes (Signed)
Pt c/o sudden onset vision change/"distortion," blurriness/ "wavy lines." Pt tried to lay down and rest, states she then felt tingling in bilateral arms. Went to ophthalmologist, "she shined a light in my eyes and it immediately hurt."  ?PERRL, no focal deficit in triage ?

## 2021-09-18 NOTE — Discharge Instructions (Signed)
Your MRI brain and orbits were okay. ?Use Tylenol and ibuprofen or Mobic together for severe headache. ?Follow-up with neurology. ? ?

## 2021-10-04 ENCOUNTER — Encounter (HOSPITAL_COMMUNITY): Payer: No Payment, Other | Admitting: Psychiatry

## 2021-10-05 ENCOUNTER — Telehealth (INDEPENDENT_AMBULATORY_CARE_PROVIDER_SITE_OTHER): Payer: No Payment, Other | Admitting: Psychiatry

## 2021-10-05 DIAGNOSIS — F411 Generalized anxiety disorder: Secondary | ICD-10-CM | POA: Diagnosis not present

## 2021-10-05 DIAGNOSIS — F3341 Major depressive disorder, recurrent, in partial remission: Secondary | ICD-10-CM

## 2021-10-05 MED ORDER — HYDROXYZINE HCL 10 MG PO TABS: 10.0000 mg | ORAL_TABLET | Freq: Two times a day (BID) | ORAL | 2 refills | Status: DC | PRN

## 2021-10-05 NOTE — Progress Notes (Signed)
BH MD/PA/NP OP Progress Note ? ?10/05/2021 5:33 PM ?Debra Herrera  ?MRN:  [id number] ? ?Virtual Visit via Telephone Note ? ?I connected with Debra Herrera on 10/05/21 at  5:30 PM EDT by telephone and verified that I am speaking with the correct person using two identifiers. ? ?Location: ?Patient: home ?Provider: offsite ?  ?I discussed the limitations, risks, security and privacy concerns of performing an evaluation and management service by telephone and the availability of in person appointments. I also discussed with the patient that there may be a patient responsible charge related to this service. The patient expressed understanding and agreed to proceed. ? ? ?  ?I discussed the assessment and treatment plan with the patient. The patient was provided an opportunity to ask questions and all were answered. The patient agreed with the plan and demonstrated an understanding of the instructions. ?  ?The patient was advised to call back or seek an in-person evaluation if the symptoms worsen or if the condition fails to improve as anticipated. ? ?I provided 5 minutes of non-face-to-face time during this encounter. ? ? ?Mcneil Sober, NP  ? ?Chief Complaint: Medication management ? ?HPI: Debra Herrera is a 41 year old female presenting to [facility] behavioral health outpatient for follow-up psychiatric evaluation.  She has a psychiatric history of major depressive disorder and generalized anxiety disorder and borderline personality disorder.  Patient's symptoms are managed with Lexapro 10 mg daily and hydroxyzine 10 mg twice daily as needed for anxiety.  Patient reports that her medications are effective with managing her symptoms and she is medication compliant.  Patient denies adverse medication effects or need for dosage adjustment today.  No medication changes today. ? ?Patient is alert and oriented x4, calm, pleasant and willing to engage.  She reports good mood, sleep and appetite.   Patient denies suicidal or homicidal ideations, paranoia, delusional thought, auditory or visual hallucinations. ? ?Visit Diagnosis:  ?  ICD-10-CM   ?1. Recurrent major depressive disorder, in partial remission (HCC)  F33.41   ?  ?2. Generalized anxiety disorder  F41.1 hydrOXYzine (ATARAX) 10 MG tablet  ?  ? ? ?Past Psychiatric History:  ? ?Past Medical History:  ?Past Medical History:  ?Diagnosis Date  ? Anxiety   ? Depression   ? Headache(784.0)   ? Human papilloma virus   ? OCD (obsessive compulsive disorder)   ? TMJ arthralgia   ? Urinary tract infection   ?  ?Past Surgical History:  ?Procedure Laterality Date  ? ADENOIDECTOMY    ? TONSILLECTOMY AND ADENOIDECTOMY    ? ? ?Family Psychiatric History: None known ? ?Family History:  ?Family History  ?Problem Relation Age of Onset  ? Diabetes Father   ? Hypertension Father   ? Hypertension Mother   ? Other Neg Hx   ? ? ?Social History:  ?Social History  ? ?Socioeconomic History  ? Marital status: Married  ?  Spouse name: Not on file  ? Number of children: Not on file  ? Years of education: Not on file  ? Highest education level: Not on file  ?Occupational History  ? Not on file  ?Tobacco Use  ? Smoking status: Never  ? Smokeless tobacco: Never  ?Vaping Use  ? Vaping Use: Never used  ?Substance and Sexual Activity  ? Alcohol use: No  ? Drug use: No  ? Sexual activity: Yes  ?  Partners: Male  ?Other Topics Concern  ? Not on file  ?Social History Narrative  ? **  Merged History Encounter **  ?    ? ?Social Determinants of Health  ? ?Financial Resource Strain: Not on file  ?Food Insecurity: Not on file  ?Transportation Needs: Not on file  ?Physical Activity: Not on file  ?Stress: Not on file  ?Social Connections: Not on file  ? ? ?Allergies:  ?Allergies  ?Allergen Reactions  ? Benadryl [Diphenhydramine Hcl] Rash  ? Sulfa Antibiotics Rash  ? ? ?Metabolic Disorder Labs: ?No results found for: HGBA1C, MPG ?No results found for: PROLACTIN ?No results found for: CHOL, TRIG,  HDL, CHOLHDL, VLDL, LDLCALC ?No results found for: TSH ? ?Therapeutic Level Labs: ?No results found for: LITHIUM ?No results found for: VALPROATE ?No components found for:  CBMZ ? ?Current Medications: ?Current Outpatient Medications  ?Medication Sig Dispense Refill  ? escitalopram (LEXAPRO) 10 MG tablet Take 1 tablet (10 mg total) by mouth daily. 90 tablet 3  ? hydrOXYzine (ATARAX) 10 MG tablet Take 1 tablet (10 mg total) by mouth 2 (two) times daily as needed. 60 tablet 2  ? levonorgestrel (MIRENA) 20 MCG/24HR IUD Mirena 20 mcg/24 hours (5 yrs) 52 mg intrauterine device ? Take 1 device by intrauterine route.    ? meloxicam (MOBIC) 15 MG tablet TAKE 1 TABLET (15 MG TOTAL) BY MOUTH DAILY. 30 tablet 3  ? methylPREDNISolone (MEDROL DOSEPAK) 4 MG TBPK tablet 6 day dose pack - take as directed 21 tablet 0  ? Multiple Vitamin (MULTIVITAMIN) tablet Take 1 tablet by mouth daily.    ? ?No current facility-administered medications for this visit.  ? ? ? ?Musculoskeletal: ?Strength & Muscle Tone: N/A virtual visit ?Gait & Station: N/A ?Patient leans: N/A ? ?Psychiatric Specialty Exam: ?Review of Systems  ?Psychiatric/Behavioral:  Negative for hallucinations, self-injury and suicidal ideas.   ?All other systems reviewed and are negative.  ?There were no vitals taken for this visit.There is no height or weight on file to calculate BMI.  ?General Appearance: NA  ?Eye Contact:  NA  ?Speech:  Clear and Coherent  ?Volume:  Normal  ?Mood:  Euthymic  ?Affect:  Appropriate  ?Thought Process:  Coherent  ?Orientation:  Full (Time, Place, and Person)  ?Thought Content: Logical   ?Suicidal Thoughts:  No  ?Homicidal Thoughts:  No  ?Memory: Good  ?Judgement: Good  ?Insight: Good  ?Psychomotor Activity: N/A  ?Concentration: Good  ?Recall: Good  ?Fund of Knowledge: Good  ?Language: Good  ?Akathisia: N/A  ?Handed: Right  ?AIMS (if indicated): Not done  ?Assets:  Desire for Improvement  ?ADL's:  Intact  ?Cognition: WNL  ?Sleep:  Good   ? ?Screenings: ?GAD-7   ? ?Flowsheet Row Video Visit from 07/20/2021 in [facility] Video Visit from 04/27/2021 in [facility] Video Visit from 01/25/2021 in [facility] Clinical Support from 08/23/2020 in [facility] Clinical Support from 05/25/2020 in [facility]  ?Total GAD-7 Score 6 10 16 16 7   ? ?  ? ?PHQ2-9   ? ?Flowsheet Row Video Visit from 07/20/2021 in [facility] Video Visit from 04/27/2021 in [facility] Video Visit from 01/25/2021 in [facility] Clinical Support from 08/23/2020 in [facility] Clinical Support from 05/25/2020 in [facility]  ?PHQ-2 Total Score 5 0 1 2 5   ?PHQ-9 Total Score 10 8 9 6 12   ? ?  ? ?Flowsheet Row ED [facility] 09[facility]18/2021 [facility] Clinical Support from [facility]/23[facility]2020 [facility]   ?C-SSRS RISK CATEGORY No Risk No Risk No Risk  ? ?  ? ? ? ?Assessment and Plan: Danella DeisStephanie Schmutz is a 41 year old [facility] for follow-up psychiatric evaluation.  She has a psychiatric history of major depressive disorder and generalized anxiety disorder and borderline personality disorder.  Patient's symptoms are managed with Lexapro 10 mg daily and hydroxyzine 10 mg twice daily as needed for anxiety.  Patient reports that her medications are effective with managing her symptoms and she is medication compliant.  Patient denies adverse medication effects or need for dosage adjustment today.  No medication changes today.  No refills required on Lexapro as patient has remaining refills available.  Hydroxyzine refilled at current dosage.   ? ?Collaboration of Care: Collaboration of Care: Medication Management AEB medication E scribed to patient's preferred pharmacy. ? ? ?1. Recurrent major depressive disorder, in partial remission (HCC) ?Continue Lexapro 10 mg. ?

## 2021-12-28 ENCOUNTER — Telehealth (HOSPITAL_COMMUNITY): Payer: No Payment, Other | Admitting: Psychiatry

## 2022-01-13 ENCOUNTER — Other Ambulatory Visit: Payer: Self-pay | Admitting: Podiatry

## 2022-01-26 ENCOUNTER — Encounter (HOSPITAL_COMMUNITY): Payer: Self-pay | Admitting: Psychiatry

## 2022-01-26 ENCOUNTER — Telehealth (INDEPENDENT_AMBULATORY_CARE_PROVIDER_SITE_OTHER): Payer: No Payment, Other | Admitting: Psychiatry

## 2022-01-26 DIAGNOSIS — F411 Generalized anxiety disorder: Secondary | ICD-10-CM | POA: Diagnosis not present

## 2022-01-26 DIAGNOSIS — F3341 Major depressive disorder, recurrent, in partial remission: Secondary | ICD-10-CM

## 2022-01-26 MED ORDER — HYDROXYZINE HCL 10 MG PO TABS: 10.0000 mg | ORAL_TABLET | Freq: Two times a day (BID) | ORAL | 3 refills | Status: DC | PRN

## 2022-01-26 MED ORDER — ESCITALOPRAM OXALATE 10 MG PO TABS: 10.0000 mg | ORAL_TABLET | Freq: Every day | ORAL | 3 refills | Status: DC

## 2022-01-26 NOTE — Progress Notes (Signed)
BH MD/PA/NP OP Progress Note Virtual Visit via Video Note  I connected with Debra Herrera on 01/26/22 at  8:00 AM EDT by a video enabled telemedicine application and verified that I am speaking with the correct person using two identifiers.  Location: Patient: Home Provider: Clinic   I discussed the limitations of evaluation and management by telemedicine and the availability of in person appointments. The patient expressed understanding and agreed to proceed.  I provided 30 minutes of non-face-to-face time during this encounter.       01/26/2022 8:22 AM Debra Herrera  MRN:  [id number]  Chief Complaint:  "Its been busy"    HPI: 41 year old female seen for follow up psychiatric evaluation. She has a psychiatric history of anxiety, OCD, and depression. She is currently prescribed Lexapro 10 mg daily and hydroxyzine 10 mg twice daily for anxiety.  Today she noted that current medication regimen is effective in managing her psychiatric conditions.  Today she is well groomed, pleasant, cooperative, engaged in conversation, and maintaining eye contact. 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  She notes recently her son has been sick so she has been nursing him back to health.  Patient reports anxiety and depression are well-managed.  At this time she request not to do a GAD-7 or PHQ-9.  These assessments can be done at patient's next visit.  Today she denies SI/HI/VH, mania, paranoia.    She informed Clinical research associate that she takes hydroxyzine infrequently but at this time wishes to continue it.  She will also continue Lexapro as prescribed.  No other concerns at this time.     Visit Diagnosis:    ICD-10-CM   1. Recurrent major depressive disorder, in partial remission (HCC)  F33.41 escitalopram (LEXAPRO) 10 MG tablet    2. Generalized anxiety disorder  F41.1 hydrOXYzine (ATARAX) 10 MG tablet      Past Psychiatric History: Anxiety, OCD, and depression  Past Medical  History:  Past Medical History:  Diagnosis Date   Anxiety    Depression    Headache(784.0)    Human papilloma virus    OCD (obsessive compulsive disorder)    TMJ arthralgia    Urinary tract infection     Past Surgical History:  Procedure Laterality Date   ADENOIDECTOMY     TONSILLECTOMY AND ADENOIDECTOMY      Family Psychiatric History: Brother Bipolar disorder, Mother OCD  Family History:  Family History  Problem Relation Age of Onset   Diabetes Father    Hypertension Father    Hypertension Mother    Other Neg Hx     Social History:  Social History   Socioeconomic History   Marital status: Married    Spouse name: Not on file   Number of children: Not on file   Years of education: Not on file   Highest education level: Not on file  Occupational History   Not on file  Tobacco Use   Smoking status: Never   Smokeless tobacco: Never  Vaping Use   Vaping Use: Never used  Substance and Sexual Activity   Alcohol use: No   Drug use: No   Sexual activity: Yes    Partners: Male  Other Topics Concern   Not on file  Social History Narrative   ** Merged History Encounter **       Social Determinants of Health   Financial Resource Strain: Not on file  Food Insecurity: Not on file  Transportation Needs: Not on file  Physical Activity:  Not on file  Stress: Not on file  Social Connections: Not on file    Allergies:  Allergies  Allergen Reactions   Benadryl [Diphenhydramine Hcl] Rash   Sulfa Antibiotics Rash    Metabolic Disorder Labs: No results found for: "HGBA1C", "MPG" No results found for: "PROLACTIN" No results found for: "CHOL", "TRIG", "HDL", "CHOLHDL", "VLDL", "LDLCALC" No results found for: "TSH"  Therapeutic Level Labs: No results found for: "LITHIUM" No results found for: "VALPROATE" No results found for: "CBMZ"  Current Medications: Current Outpatient Medications  Medication Sig Dispense Refill   escitalopram (LEXAPRO) 10 MG tablet Take  1 tablet (10 mg total) by mouth daily. 90 tablet 3   hydrOXYzine (ATARAX) 10 MG tablet Take 1 tablet (10 mg total) by mouth 2 (two) times daily as needed. 60 tablet 3   levonorgestrel (MIRENA) 20 MCG/24HR IUD Mirena 20 mcg/24 hours (5 yrs) 52 mg intrauterine device  Take 1 device by intrauterine route.     meloxicam (MOBIC) 15 MG tablet TAKE 1 TABLET (15 MG TOTAL) BY MOUTH DAILY. 30 tablet 3   methylPREDNISolone (MEDROL DOSEPAK) 4 MG TBPK tablet 6 day dose pack - take as directed 21 tablet 0   Multiple Vitamin (MULTIVITAMIN) tablet Take 1 tablet by mouth daily.     No current facility-administered medications for this visit.     Musculoskeletal: Strength & Muscle Tone: within normal limits and  telehealth visit Gait & Station: normal, telehealth visit Patient leans: N/A  Psychiatric Specialty Exam: Review of Systems  There were no vitals taken for this visit.There is no height or weight on file to calculate BMI.  General Appearance: Well Groomed  Eye Contact:  Good  Speech:  Clear and Coherent and Normal Rate  Volume:  Normal  Mood:  Euthymic  Affect:  Appropriate and Congruent  Thought Process:  Coherent, Goal Directed and Linear  Orientation:  Full (Time, Place, and Person)  Thought Content: WDL and Logical   Suicidal Thoughts:  No  Homicidal Thoughts:  No  Memory:  Immediate;   Good Recent;   Good Remote;   Good  Judgement:  Good  Insight:  Good  Psychomotor Activity:  Normal  Concentration:  Concentration: Good and Attention Span: Good  Recall:  Good  Fund of Knowledge: Good  Language: Good  Akathisia:  No  Handed:  Right  AIMS (if indicated): Not done  Assets:  Communication Skills Desire for Improvement Financial Resources/Insurance Housing Social Support  ADL's:  Intact  Cognition: WNL  Sleep:  Good   Screenings: GAD-7    Flowsheet Row Video Visit from 07/20/2021 in [facility] Video Visit from 04/27/2021 in [facility] Video Visit from 01/25/2021 in [facility] Clinical Support [facility] Clinical Support from 05/25/2020 [facility]  Total GAD-7 Score 6 10 16 16 7       PHQ2-9    Flowsheet Row Video Visit from 07/20/2021 in [facility] Video Visit from 04/27/2021 in [facility] Video Visit from 01/25/2021 in [facility] Clinical Support from 08/23/2020 in [facility] Clinical Support from 05/25/2020 in [facility]  PHQ-2 Total Score 5 0 1 2 5   PHQ-9 Total Score 10 8 9 6 12       Flowsheet Row ED from 09/18/2021 in [facility] Clinical Support  from 08/23/2020 in [facility] ED from 08/17/2020 in [facility]   C-SSRS RISK CATEGORY No Risk No Risk No Risk        Assessment and Plan: Patient notes that she is doing well on her current medication regimen.  No medication changes made today.  Patient agreeable to continue medication as prescribed.   1. Recurrent major depressive disorder, in partial remission (HCC)  Continue- escitalopram (LEXAPRO) 10 MG tablet; Take 1 tablet (10 mg total) by mouth daily.  Dispense: 90 tablet; Refill: 3  2. Grief   3. Generalized anxiety disorder Continue- hydrOXYzine (ATARAX) 10 MG tablet; Take 1 tablet (10 mg total) by mouth 2 (two) times daily as needed.  Dispense: 60 tablet; Refill: 3      Follow up in 3 months.    Shanna Cisco, NP 01/26/2022, 8:22 AM

## 2022-03-01 ENCOUNTER — Ambulatory Visit
Admission: EM | Admit: 2022-03-01 | Discharge: 2022-03-01 | Disposition: A | Discharge status: Home / Self Care | Payer: Self-pay | Attending: Internal Medicine | Admitting: Internal Medicine

## 2022-03-01 ENCOUNTER — Encounter: Payer: Self-pay | Admitting: Emergency Medicine

## 2022-03-01 ENCOUNTER — Other Ambulatory Visit: Payer: Self-pay

## 2022-03-01 ENCOUNTER — Ambulatory Visit (INDEPENDENT_AMBULATORY_CARE_PROVIDER_SITE_OTHER): Payer: Self-pay

## 2022-03-01 DIAGNOSIS — J208 Acute bronchitis due to other specified organisms: Secondary | ICD-10-CM

## 2022-03-01 DIAGNOSIS — R059 Cough, unspecified: Secondary | ICD-10-CM

## 2022-03-01 MED ORDER — BENZONATATE 100 MG PO CAPS: 100.0000 mg | ORAL_CAPSULE | Freq: Three times a day (TID) | ORAL | 0 refills | Status: DC | PRN

## 2022-03-01 NOTE — ED Triage Notes (Signed)
Pt here for cough and body aches x 5 days 

## 2022-03-01 NOTE — Discharge Instructions (Signed)
It appears that you have a viral upper respiratory infection that is causing bronchitis that should run its course and self resolve with symptomatic treatment.  I have sent you a cough medication to help alleviate symptoms.  Please follow-up if any symptoms persist or worsen.

## 2022-03-01 NOTE — ED Provider Notes (Signed)
[facility]    CSN: [id number] Arrival date & time: 03/01/22  1219      History   Chief Complaint Chief Complaint  Patient presents with   Cough    HPI Debra Herrera is a 41 y.o. female.   Patient presents with cough and generalized body aches that started about 5 days ago.  Patient reports cough is harsh and she has some back pain associated with in the mid back.  Denies any nasal congestion, runny nose, fever, sore throat, ear pain, nausea, vomiting, diarrhea, abdominal pain, chest pain, shortness of breath.  Patient reports taking Mucinex over-the-counter with some improvement of symptoms.  Denies history of asthma or COPD.   Cough   Past Medical History:  Diagnosis Date   Anxiety    Depression    Headache(784.0)    Human papilloma virus    OCD (obsessive compulsive disorder)    TMJ arthralgia    Urinary tract infection     Patient Active Problem List   Diagnosis Date Noted   Grief 11/16/2020   Candidiasis of skin 08/16/2020   Cyst of ovary 08/16/2020   Lesion of vulva 08/16/2020   Borderline personality disorder (HCC) 09/18/2018   Major depressive disorder, recurrent episode (HCC) 09/18/2018   Reaction to severe stress, unspecified 09/18/2018   Generalized anxiety disorder 09/18/2018   Amenorrhea, secondary 02/07/2011   DEPRESSIVE DISORDER NOT ELSEWHERE CLASSIFIED 10/12/2008    Past Surgical History:  Procedure Laterality Date   ADENOIDECTOMY     TONSILLECTOMY AND ADENOIDECTOMY      OB History     Gravida  2   Para  2   Term  2   Preterm  0   AB  0   Living  2      SAB  0   IAB  0   Ectopic  0   Multiple      Live Births  2            Home Medications    Prior to Admission medications   Medication Sig Start Date End Date Taking? Authorizing Provider  escitalopram (LEXAPRO) 10 MG tablet Take 1 tablet (10 mg total) by mouth daily. 01/26/22   Shanna Cisco, NP  hydrOXYzine (ATARAX) 10 MG tablet  Take 1 tablet (10 mg total) by mouth 2 (two) times daily as needed. 01/26/22   Shanna Cisco, NP  levonorgestrel (MIRENA) 20 MCG/24HR IUD Mirena 20 mcg/24 hours (5 yrs) 52 mg intrauterine device  Take 1 device by intrauterine route.    [provider]  meloxicam (MOBIC) 15 MG tablet TAKE 1 TABLET (15 MG TOTAL) BY MOUTH DAILY. 01/15/22   Hyatt, Max T, DPM  methylPREDNISolone (MEDROL DOSEPAK) 4 MG TBPK tablet 6 day dose pack - take as directed Patient not taking: Reported on 03/01/2022 08/16/20   Ernestene Kiel T, DPM  Multiple Vitamin (MULTIVITAMIN) tablet Take 1 tablet by mouth daily.    [provider]    Family History Family History  Problem Relation Age of Onset   Diabetes Father    Hypertension Father    Hypertension Mother    Other Neg Hx     Social History Social History   Tobacco Use   Smoking status: Never   Smokeless tobacco: Never  Vaping Use   Vaping Use: Never used  Substance Use Topics   Alcohol use: No   Drug use: No     Allergies   Benadryl [diphenhydramine hcl] and  Sulfa antibiotics   Review of Systems Review of Systems Per HPI  Physical Exam Triage Vital Signs ED Triage Vitals [03/01/22 1335]  Enc Vitals Group     BP 132/83     Pulse Rate 98     Resp 18     Temp 98.2 F (36.8 C)     Temp Source Oral     SpO2 96 %     Weight      Height      Head Circumference      Peak Flow      Pain Score 4     Pain Loc      Pain Edu?      Excl. in GC?    No data found.  Updated Vital Signs BP 132/83 (BP Location: Left Arm)   Pulse 98   Temp 98.2 F (36.8 C) (Oral)   Resp 18   SpO2 96%   Visual Acuity Right Eye Distance:   Left Eye Distance:   Bilateral Distance:    Right Eye Near:   Left Eye Near:    Bilateral Near:     Physical Exam Constitutional:      General: She is not in acute distress.    Appearance: Normal appearance. She is not toxic-appearing or diaphoretic.  HENT:     Head: Normocephalic and atraumatic.      Right Ear: Tympanic membrane and ear canal normal.     Left Ear: Tympanic membrane and ear canal normal.     Nose: Congestion present.     Mouth/Throat:     Mouth: Mucous membranes are moist.     Pharynx: No posterior oropharyngeal erythema.  Eyes:     Extraocular Movements: Extraocular movements intact.     Conjunctiva/sclera: Conjunctivae normal.     Pupils: Pupils are equal, round, and reactive to light.  Cardiovascular:     Rate and Rhythm: Normal rate and regular rhythm.     Pulses: Normal pulses.     Heart sounds: Normal heart sounds.  Pulmonary:     Effort: Pulmonary effort is normal. No respiratory distress.     Breath sounds: Normal breath sounds. No stridor. No wheezing, rhonchi or rales.  Abdominal:     General: Abdomen is flat. Bowel sounds are normal.     Palpations: Abdomen is soft.  Musculoskeletal:        General: Normal range of motion.     Cervical back: Normal range of motion.  Skin:    General: Skin is warm and dry.  Neurological:     General: No focal deficit present.     Mental Status: She is alert and oriented to person, place, and time. Mental status is at baseline.  Psychiatric:        Mood and Affect: Mood normal.        Behavior: Behavior normal.      UC Treatments / Results  Labs (all labs ordered are listed, but only abnormal results are displayed) Labs Reviewed - No data to display  EKG   Radiology No results found.  Procedures Procedures (including critical care time)  Medications Ordered in UC Medications - No data to display  Initial Impression / Assessment and Plan / UC Course  I have reviewed the triage vital signs and the nursing notes.  Pertinent labs & imaging results that were available during my care of the patient were reviewed by me and considered in my medical decision making (see chart for details).  Patient's symptoms appear viral in etiology.  Chest x-ray is negative for any acute cardiopulmonary process.   Suspect possible viral bronchitis.  Patient was offered prednisone as I think this would benefit patient given harsh cough but patient declined.  Will prescribe cough medication.  Discussed supportive care and symptom management with patient.  Discussed return precautions.  Patient declined COVID testing.  Patient verbalized understanding and was agreeable with plan. Final Clinical Impressions(s) / UC Diagnoses   Final diagnoses:  None   Discharge Instructions   None    ED Prescriptions   None    PDMP not reviewed this encounter.   Gustavus Bryant, [place] 03/01/22 1422

## 2022-03-19 ENCOUNTER — Other Ambulatory Visit: Payer: Self-pay

## 2022-03-19 ENCOUNTER — Encounter (HOSPITAL_COMMUNITY): Payer: Self-pay | Admitting: *Deleted

## 2022-03-19 ENCOUNTER — Ambulatory Visit (HOSPITAL_COMMUNITY)
Admission: EM | Admit: 2022-03-19 | Discharge: 2022-03-19 | Disposition: A | Discharge status: Home / Self Care | Payer: Self-pay | Attending: Family Medicine | Admitting: Family Medicine

## 2022-03-19 DIAGNOSIS — J309 Allergic rhinitis, unspecified: Secondary | ICD-10-CM

## 2022-03-19 DIAGNOSIS — R059 Cough, unspecified: Secondary | ICD-10-CM

## 2022-03-19 MED ORDER — AZITHROMYCIN 250 MG PO TABS: 250.0000 mg | ORAL_TABLET | Freq: Every day | ORAL | 0 refills | Status: DC

## 2022-03-19 MED ORDER — FEXOFENADINE HCL 180 MG PO TABS: 180.0000 mg | ORAL_TABLET | Freq: Every day | ORAL | 0 refills | Status: DC

## 2022-03-19 NOTE — ED Triage Notes (Signed)
Pt reports cough started on 02-26-22 . Pt was seen  on 03-01-22 and treated . Pt has been taking the medication . Pt reports coughing so hard today that she vomited and saw blood in mucous .

## 2022-03-19 NOTE — ED Provider Notes (Signed)
[facility]    CSN: [id number] Arrival date & time: 03/19/22  1514      History   Chief Complaint Chief Complaint  Patient presents with   Cough    HPI Debra Herrera is a 41 y.o. female.   HPI 41 year old female presents with cough for 3 weeks.  Patient was evaluated and treated here on 03/01/22 for viral bronchitis.  At that time CXR was clear patient was offered steroids and declined.  PMH significant for borderline personality disorder, anxiety, and depression.  Past Medical History:  Diagnosis Date   Anxiety    Depression    Headache(784.0)    Human papilloma virus    OCD (obsessive compulsive disorder)    TMJ arthralgia    Urinary tract infection     Patient Active Problem List   Diagnosis Date Noted   Grief 11/16/2020   Candidiasis of skin 08/16/2020   Cyst of ovary 08/16/2020   Lesion of vulva 08/16/2020   Borderline personality disorder (HCC) 09/18/2018   Major depressive disorder, recurrent episode (HCC) 09/18/2018   Reaction to severe stress, unspecified 09/18/2018   Generalized anxiety disorder 09/18/2018   Amenorrhea, secondary 02/07/2011   DEPRESSIVE DISORDER NOT ELSEWHERE CLASSIFIED 10/12/2008    Past Surgical History:  Procedure Laterality Date   ADENOIDECTOMY     TONSILLECTOMY AND ADENOIDECTOMY      OB History     Gravida  2   Para  2   Term  2   Preterm  0   AB  0   Living  2      SAB  0   IAB  0   Ectopic  0   Multiple      Live Births  2            Home Medications    Prior to Admission medications   Medication Sig Start Date End Date Taking? Authorizing Provider  azithromycin (ZITHROMAX) 250 MG tablet Take 1 tablet (250 mg total) by mouth daily. Take first 2 tablets together, then 1 every day until finished. 03/19/22  Yes Trevor Iha, FNP  fexofenadine [facility] ALLERGY) 180 MG tablet Take 1 tablet (180 mg total) by mouth daily for 15 days. 03/19/22 04/03/22 Yes Trevor Iha, FNP   benzonatate (TESSALON) 100 MG capsule Take 1 capsule (100 mg total) by mouth every 8 (eight) hours as needed for cough. 03/01/22   Gustavus Bryant, FNP  escitalopram (LEXAPRO) 10 MG tablet Take 1 tablet (10 mg total) by mouth daily. 01/26/22   Shanna Cisco, NP  hydrOXYzine (ATARAX) 10 MG tablet Take 1 tablet (10 mg total) by mouth 2 (two) times daily as needed. 01/26/22   Shanna Cisco, NP  levonorgestrel (MIRENA) 20 MCG/24HR IUD Mirena 20 mcg/24 hours (5 yrs) 52 mg intrauterine device  Take 1 device by intrauterine route.    [provider]  meloxicam (MOBIC) 15 MG tablet TAKE 1 TABLET (15 MG TOTAL) BY MOUTH DAILY. 01/15/22   Hyatt, Max T, DPM  methylPREDNISolone (MEDROL DOSEPAK) 4 MG TBPK tablet 6 day dose pack - take as directed Patient not taking: Reported on 03/01/2022 08/16/20   Ernestene Kiel T, DPM  Multiple Vitamin (MULTIVITAMIN) tablet Take 1 tablet by mouth daily.    [provider]    Family History Family History  Problem Relation Age of Onset   Diabetes Father    Hypertension Father    Hypertension Mother    Other Neg Hx  Social History Social History   Tobacco Use   Smoking status: Never   Smokeless tobacco: Never  Vaping Use   Vaping Use: Never used  Substance Use Topics   Alcohol use: No   Drug use: No     Allergies   Benadryl [diphenhydramine hcl] and Sulfa antibiotics   Review of Systems Review of Systems  Respiratory:  Positive for cough.   All other systems reviewed and are negative.    Physical Exam Triage Vital Signs ED Triage Vitals [03/19/22 1557]  Enc Vitals Group     BP 110/76     Pulse Rate 81     Resp 18     Temp 98.6 F (37 C)     Temp src      SpO2 96 %     Weight      Height      Head Circumference      Peak Flow      Pain Score      Pain Loc      Pain Edu?      Excl. in [place]?    No data found.  Updated Vital Signs BP 110/76   Pulse 81   Temp 98.6 F (37 C)   Resp 18   LMP 03/16/2022   SpO2  96%    Physical Exam Vitals and nursing note reviewed.  Constitutional:      General: She is not in acute distress.    Appearance: Normal appearance. She is normal weight. She is not ill-appearing.  HENT:     Head: Normocephalic and atraumatic.     Right Ear: Tympanic membrane, ear canal and external ear normal.     Left Ear: Tympanic membrane, ear canal and external ear normal.     Mouth/Throat:     Mouth: Mucous membranes are moist.     Pharynx: Oropharynx is clear.     Comments: Moderate to significant amount of clear drainage of posterior oropharynx noted Eyes:     Extraocular Movements: Extraocular movements intact.     Conjunctiva/sclera: Conjunctivae normal.     Pupils: Pupils are equal, round, and reactive to light.  Cardiovascular:     Rate and Rhythm: Normal rate and regular rhythm.     Pulses: Normal pulses.     Heart sounds: Normal heart sounds. No murmur heard. Pulmonary:     Effort: Pulmonary effort is normal.     Breath sounds: Normal breath sounds. No wheezing, rhonchi or rales.     Comments: Infrequent nonproductive cough noted on exam Musculoskeletal:        General: Normal range of motion.     Cervical back: Normal range of motion and neck supple.  Skin:    General: Skin is warm and dry.  Neurological:     General: No focal deficit present.     Mental Status: She is alert and oriented to person, place, and time. Mental status is at baseline.      UC Treatments / Results  Labs (all labs ordered are listed, but only abnormal results are displayed) Labs Reviewed - No data to display  EKG   Radiology No results found.  Procedures Procedures (including critical care time)  Medications Ordered in UC Medications - No data to display  Initial Impression / Assessment and Plan / UC Course  I have reviewed the triage vital signs and the nursing notes.  Pertinent labs & imaging results that were available during my care of the patient were  reviewed  by me and considered in my medical decision making (see chart for details).     MDM: 1.  Cough-Rx Zithromax; 2.  Allergic rhinitis-Rx'd Allegra. Advised patient to take medication as directed with food to completion.  Advised patient to take Allegra with Zithromax daily for the next 5 days.  Advised patient may use Allegra as needed afterwards for concurrent postnasal drip/drainage.  Encouraged patient to increase daily water intake while taking these medications.  Advised if symptoms worsen and/or unresolved please follow-up with PCP or here for further evaluation.  Patient discharged home, hemodynamically stable. Final Clinical Impressions(s) / UC Diagnoses   Final diagnoses:  Cough, unspecified type  Allergic rhinitis, unspecified seasonality, unspecified trigger     Discharge Instructions      Advised patient to take medication as directed with food to completion.  Advised patient to take Allegra with Zithromax daily for the next 5 days.  Advised patient may use Allegra as needed afterwards for concurrent postnasal drip/drainage.  Encouraged patient to increase daily water intake while taking these medications.  Advised if symptoms worsen and/or unresolved please follow-up with PCP or here for further evaluation.     ED Prescriptions     Medication Sig Dispense Auth. Provider   azithromycin (ZITHROMAX) 250 MG tablet Take 1 tablet (250 mg total) by mouth daily. Take first 2 tablets together, then 1 every day until finished. 6 tablet Trevor Iha, FNP   fexofenadine [facility] ALLERGY) 180 MG tablet Take 1 tablet (180 mg total) by mouth daily for 15 days. 15 tablet Trevor Iha, FNP      PDMP not reviewed this encounter.   Trevor Iha, FNP 03/19/22 1629

## 2022-03-19 NOTE — Discharge Instructions (Addendum)
Advised patient to take medication as directed with food to completion.  Advised patient to take Allegra with Zithromax daily for the next 5 days.  Advised patient may use Allegra as needed afterwards for concurrent postnasal drip/drainage.  Encouraged patient to increase daily water intake while taking these medications.  Advised if symptoms worsen and/or unresolved please follow-up with PCP or here for further evaluation.

## 2022-04-24 ENCOUNTER — Ambulatory Visit (INDEPENDENT_AMBULATORY_CARE_PROVIDER_SITE_OTHER): Payer: No Payment, Other | Admitting: Psychiatry

## 2022-04-24 ENCOUNTER — Encounter (HOSPITAL_COMMUNITY): Payer: Self-pay | Admitting: Psychiatry

## 2022-04-24 DIAGNOSIS — F411 Generalized anxiety disorder: Secondary | ICD-10-CM

## 2022-04-24 DIAGNOSIS — F3341 Major depressive disorder, recurrent, in partial remission: Secondary | ICD-10-CM | POA: Diagnosis not present

## 2022-04-24 MED ORDER — ESCITALOPRAM OXALATE 20 MG PO TABS: 20.0000 mg | ORAL_TABLET | Freq: Every day | ORAL | 3 refills | Status: DC

## 2022-04-24 MED ORDER — HYDROXYZINE HCL 10 MG PO TABS: 10.0000 mg | ORAL_TABLET | Freq: Two times a day (BID) | ORAL | 3 refills | Status: DC | PRN

## 2022-04-24 NOTE — Progress Notes (Signed)
[facility] MD/PA/NP OP Progress Note       04/24/2022 1:00 PM Debra Herrera  MRN:  [id number]  Chief Complaint:  "This month has been a downward spiral"    HPI: 41 year old female seen for follow up psychiatric evaluation. She has a psychiatric history of anxiety, OCD, and depression. She is currently prescribed Lexapro 10 mg daily and hydroxyzine 10 mg twice daily for anxiety.  Today she noted that current medication regimen is not  effective in managing her psychiatric conditions.  Today she is well groomed, pleasant, cooperative, engaged in conversation, and maintaining eye contact. She informed Clinical research associate that the last month she has been in a downward spiral. She notes that she is having marital stressors. Patient notes that she is in an open relationship that her husband does not approve of.  She reports that she continues to hurt the people that are closest to her and her husband and another gentleman who desires to be with her.  Patient notes that she has been anxious being stressed about the holidays.  She notes that its been over a year since her father died and she is getting anxious at the [place] approach.  Patient notes that she and her mother do not have the best relationship and at this time she feels that she has no one to talk to.  Provider recommended patient speak to her therapist which she was agreeable to.  Provider conducted a GAD-7 and patient scored a 17.  Provider also conducted PHQ-9 and patient scored a 23.  She endorses poor sleep.  She notes that her appetite is adequate.  Patient endorses passive SI but denies wanting to harm himself today.  Today she denies SI/HI/VAH, mania, paranoia.   Patient asked if Wellbutrin was a good option to help manage the symptoms.  Provider informed patient that Wellbutrin does not help with anxiety and may worsen her insomnia.  Today she is agreeable to increasing Lexapro 10 mg to 20 mg to help manage anxiety and depression.  She will  continue hydroxyzine as prescribed.  No other concerns noted at this time.   Visit Diagnosis:    ICD-10-CM   1. Recurrent major depressive disorder, in partial remission (HCC)  F33.41 escitalopram (LEXAPRO) 20 MG tablet    2. Generalized anxiety disorder  F41.1 hydrOXYzine (ATARAX) 10 MG tablet      Past Psychiatric History: Anxiety, OCD, and depression  Past Medical History:  Past Medical History:  Diagnosis Date   Anxiety    Depression    Headache(784.0)    Human papilloma virus    OCD (obsessive compulsive disorder)    TMJ arthralgia    Urinary tract infection     Past Surgical History:  Procedure Laterality Date   ADENOIDECTOMY     TONSILLECTOMY AND ADENOIDECTOMY      Family Psychiatric History: Brother Bipolar disorder, Mother OCD  Family History:  Family History  Problem Relation Age of Onset   Diabetes Father    Hypertension Father    Hypertension Mother    Other Neg Hx     Social History:  Social History   Socioeconomic History   Marital status: Married    Spouse name: Not on file   Number of children: Not on file   Years of education: Not on file   Highest education level: Not on file  Occupational History   Not on file  Tobacco Use   Smoking status: Never   Smokeless tobacco: Never  Vaping Use  Vaping Use: Never used  Substance and Sexual Activity   Alcohol use: No   Drug use: No   Sexual activity: Yes    Partners: Male  Other Topics Concern   Not on file  Social History Narrative   ** Merged History Encounter **       Social Determinants of Health   Financial Resource Strain: Not on file  Food Insecurity: Not on file  Transportation Needs: Not on file  Physical Activity: Not on file  Stress: Not on file  Social Connections: Not on file    Allergies:  Allergies  Allergen Reactions   Benadryl [Diphenhydramine Hcl] Rash   Sulfa Antibiotics Rash    Metabolic Disorder Labs: No results found for: "HGBA1C", "MPG" No results  found for: "PROLACTIN" No results found for: "CHOL", "TRIG", "HDL", "CHOLHDL", "VLDL", "LDLCALC" No results found for: "TSH"  Therapeutic Level Labs: No results found for: "LITHIUM" No results found for: "VALPROATE" No results found for: "CBMZ"  Current Medications: Current Outpatient Medications  Medication Sig Dispense Refill   azithromycin (ZITHROMAX) 250 MG tablet Take 1 tablet (250 mg total) by mouth daily. Take first 2 tablets together, then 1 every day until finished. 6 tablet 0   benzonatate (TESSALON) 100 MG capsule Take 1 capsule (100 mg total) by mouth every 8 (eight) hours as needed for cough. 21 capsule 0   escitalopram (LEXAPRO) 20 MG tablet Take 1 tablet (20 mg total) by mouth daily. 30 tablet 3   fexofenadine (ALLEGRA ALLERGY) 180 MG tablet Take 1 tablet (180 mg total) by mouth daily for 15 days. 15 tablet 0   hydrOXYzine (ATARAX) 10 MG tablet Take 1 tablet (10 mg total) by mouth 2 (two) times daily as needed. 60 tablet 3   levonorgestrel (MIRENA) 20 MCG/24HR IUD Mirena 20 mcg/24 hours (5 yrs) 52 mg intrauterine device  Take 1 device by intrauterine route.     meloxicam (MOBIC) 15 MG tablet TAKE 1 TABLET (15 MG TOTAL) BY MOUTH DAILY. 30 tablet 3   methylPREDNISolone (MEDROL DOSEPAK) 4 MG TBPK tablet 6 day dose pack - take as directed (Patient not taking: Reported on 03/01/2022) 21 tablet 0   Multiple Vitamin (MULTIVITAMIN) tablet Take 1 tablet by mouth daily.     No current facility-administered medications for this visit.     Musculoskeletal: Strength & Muscle Tone: within normal limits Gait & Station: normal Patient leans: N/A  Psychiatric Specialty Exam: Review of Systems  There were no vitals taken for this visit.There is no height or weight on file to calculate BMI.  General Appearance: Well Groomed  Eye Contact:  Good  Speech:  Clear and Coherent and Normal Rate  Volume:  Normal  Mood:  Anxious  Affect:  Appropriate and Congruent  Thought Process:   Coherent, Goal Directed and Linear  Orientation:  Full (Time, Place, and Person)  Thought Content: WDL and Logical   Suicidal Thoughts:  Yes.  without intent/plan  Homicidal Thoughts:  No  Memory:  Immediate;   Good Recent;   Good Remote;   Good  Judgement:  Good  Insight:  Good  Psychomotor Activity:  Normal  Concentration:  Concentration: Good and Attention Span: Good  Recall:  Good  Fund of Knowledge: Good  Language: Good  Akathisia:  No  Handed:  Right  AIMS (if indicated): Not done  Assets:  Communication Skills Desire for Improvement Financial Resources/Insurance Housing Social Support  ADL's:  Intact  Cognition: WNL  Sleep:  Good  Screenings: GAD-7    Flowsheet Row Clinical Support from [facility]/24/2022 in [facility] Video Visit from 07/20/2021 in [facility] Video Visit from 04/27/2021 in [facility] Video Visit from 01/25/2021 in [facility] Clinical Support from 08/23/2020 in [facility]  Total GAD-7 Score 17 6 10 16 16       PHQ2-9    Flowsheet Row Clinical Support from 04/24/2022 in [facility] Video Visit from 07/20/2021 in [facility] Video Visit from 04/27/2021 in [facility] Video Visit from 01/25/2021 in [facility] Clinical Support from 08/23/2020 in [facility]  PHQ-2 Total Score 6 5 0 1 2  PHQ-9 Total Score 23 10 8 9 6       Flowsheet Row Clinical Support from 04/24/2022 in [facility] ED from 03/19/2022 in [facility] ED from 03/01/2022 in [facility]   C-SSRS RISK CATEGORY Low Risk No Risk No Risk        Assessment and Plan: Patient notes that she has bee  more anxious and depressed.  She also  endorses symptoms of insomnia.  Patient asked if Wellbutrin was a good option to help manage the symptoms.  Provider informed patient that Wellbutrin does not help with anxiety and may worsen her insomnia.  Today she is agreeable to increasing Lexapro 10 mg to 20 mg to help manage anxiety and depression.  She will continue hydroxyzine as prescribed.   1. Recurrent major depressive disorder, in partial remission (HCC)  Increased- escitalopram (LEXAPRO) 20 MG tablet; Take 1 tablet (20 mg total) by mouth daily.  Dispense: 30 tablet; Refill: 3  2. Generalized anxiety disorder  Continue- hydrOXYzine (ATARAX) 10 MG tablet; Take 1 tablet (10 mg total) by mouth 2 (two) times daily as needed.  Dispense: 60 tablet; Refill: 3       Follow up in 3 months.    [facility], NP 04/24/2022, 1:00 PM

## 2022-05-14 ENCOUNTER — Encounter (HOSPITAL_COMMUNITY): Payer: Self-pay

## 2022-05-14 ENCOUNTER — Ambulatory Visit (INDEPENDENT_AMBULATORY_CARE_PROVIDER_SITE_OTHER): Payer: No Payment, Other | Admitting: Clinical

## 2022-05-14 DIAGNOSIS — F411 Generalized anxiety disorder: Secondary | ICD-10-CM

## 2022-05-14 DIAGNOSIS — F3341 Major depressive disorder, recurrent, in partial remission: Secondary | ICD-10-CM

## 2022-06-19 ENCOUNTER — Ambulatory Visit (INDEPENDENT_AMBULATORY_CARE_PROVIDER_SITE_OTHER): Payer: No Payment, Other | Admitting: Clinical

## 2022-06-19 DIAGNOSIS — F3341 Major depressive disorder, recurrent, in partial remission: Secondary | ICD-10-CM | POA: Diagnosis not present

## 2022-06-19 NOTE — Progress Notes (Signed)
   THERAPIST PROGRESS NOTE  Session Time: 45 minutes  Participation Level: Active  Behavioral Response: CasualAlertAnxious  Type of Therapy: Individual Therapy  Treatment Goals addressed: client will practice problem solving skill 3 times per week for the next 12 weeks  ProgressTowards Goals: Progressing  Interventions: CBT and Supportive  Summary:  Debra Herrera is a 42 y.o. female who presents for the scheduled appointment oriented times five, appropriately dressed, and friendly. Client denied hallucinations and delusions. Client reported on today she is doing fairly well she has been recovering from the flu.  Client reported otherwise that she has been working through things regarding her marriage and home life.  Client reported it seems that some of the issues within her marriage and relationships have calmed down.  Client reported with her open marriage she and her husband have not been in love in a long time.  Client reported it is hard to talk to him about how she feels lonely or the lack of communication.  Client reported he made it clear sometime ago that he was not open to therapy to helping to fix the relationship.  Client reported that is what played a part in her looking for romanticism and companionship from other men.  Client reported she has been romantically interested in her longtime female friend who she is known him since grade school.  Client reported she finds herself wanting to talk to him frequently and reaching out more than he does.  Client reported she thinks it would help if she would go back to working doing [employer] to have some things to fill her time. Evidence of progress towards goal:  client identified 2 stressors in her home and marriage life that contribute to depression.   Suicidal/Homicidal: Nowithout intent/plan  Therapist Response:  Therapist began the appointment asking the client how she has been doing since last seen. Therapist used CBT  to engage using active listening and positive emotional support. Therapist used CBT to engage ask the client to describe her thoughts and emotions pertaining to her marriage, interpersonal relationships, and having her own activity/ job to look forward to. Therapist used CBT to engage and discuss having her activities that she looks forward to. Therapist used CBT ask the client to identify her progress with frequency of use with coping skills with continued practice in her daily activity.    Therapist assigned the client homework to practice self care.   Plan: Return again in 3 weeks.  Diagnosis: recurrent major depressive disorder ,in partial remission  Collaboration of Care: Patient refused AEB none requested by the client.  Patient/Guardian was advised Release of Information must be obtained prior to any record release in order to collaborate their care with an outside provider. Patient/Guardian was advised if they have not already done so to contact the registration department to sign all necessary forms in order for [place] to release information regarding their care.   Consent: Patient/Guardian gives verbal consent for treatment and assignment of benefits for services provided during this visit. Patient/Guardian expressed understanding and agreed to proceed.   [place], LCSW 06/19/2022
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
## 2022-07-03 ENCOUNTER — Ambulatory Visit (INDEPENDENT_AMBULATORY_CARE_PROVIDER_SITE_OTHER): Payer: No Payment, Other | Admitting: Clinical

## 2022-07-03 DIAGNOSIS — F3341 Major depressive disorder, recurrent, in partial remission: Secondary | ICD-10-CM | POA: Diagnosis not present
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
## 2022-07-10 ENCOUNTER — Other Ambulatory Visit: Payer: Self-pay | Admitting: Podiatry

## 2022-07-17 ENCOUNTER — Ambulatory Visit (INDEPENDENT_AMBULATORY_CARE_PROVIDER_SITE_OTHER): Payer: No Payment, Other | Admitting: Clinical

## 2022-07-17 DIAGNOSIS — F411 Generalized anxiety disorder: Secondary | ICD-10-CM | POA: Diagnosis not present

## 2022-07-17 NOTE — Progress Notes (Signed)
   THERAPIST PROGRESS NOTE  Session Time: 40 minutes  Participation Level: Active  Behavioral Response: CasualAlertDepressed  Type of Therapy: Individual Therapy  Treatment Goals addressed: client will practice problem solving skills 3 times per week for the next 4 weeks.  ProgressTowards Goals: Progressing  Interventions: CBT and Supportive  Summary:  Debra Herrera is a 42 y.o. female who presents for the scheduled appointment oriented times five, appropriately dressed, and friendly. Client denied hallucinations and delusions. Client reported she is doing fairly well. Client reported she took her son to complete the forensic interview which took approx. 2 hours. Client reported her son seems to still be doing fine. Client reported otherwise her home life continues to be a challenge. Client reported her husband is always negative, argumentative, and stressed. Client reported he refuses to get help or communicate about what's bothering him. Client reported she has positive things in her life but their aren't any at home. Client reported she spends time with her guy friend and his family. Client reported she thinks about a lot of other options for her future with her and the kids.  Evidence of progress towards goal:  client reported using behavioral activation at least 2 days out of 7.  Suicidal/Homicidal: Nowithout intent/plan  Therapist Response:  Therapist began the appointment asking the client how she has been doing since last seen. Therapist used CBT to engage using active listening and positive emotional support. Therapist used CBT to engage and ask the client how she is coping with stressors related to her children. Therapist used CBT to engage and ask the client about her thoughts on how her relationship impacts her mood. Therapist used CBT to reinforce the use of boundaries and assertive communication Therapist used CBT ask the client to identify her progress with  frequency of use with coping skills with continued practice in her daily activity.    Therapist assigned the client homework to practice self care.    Plan: Return again in 3 weeks.  Diagnosis: GAD  Collaboration of Care: Patient refused AEB none requested by the client.  Patient/Guardian was advised Release of Information must be obtained prior to any record release in order to collaborate their care with an outside provider. Patient/Guardian was advised if they have not already done so to contact the registration department to sign all necessary forms in order for [place] to release information regarding their care.   Consent: Patient/Guardian gives verbal consent for treatment and assignment of benefits for services provided during this visit. Patient/Guardian expressed understanding and agreed to proceed.   [place], LCSW 07/17/2022

## 2022-07-24 ENCOUNTER — Ambulatory Visit (INDEPENDENT_AMBULATORY_CARE_PROVIDER_SITE_OTHER): Payer: No Payment, Other | Admitting: Psychiatry

## 2022-07-24 ENCOUNTER — Encounter (HOSPITAL_COMMUNITY): Payer: Self-pay | Admitting: Psychiatry

## 2022-07-24 DIAGNOSIS — F3341 Major depressive disorder, recurrent, in partial remission: Secondary | ICD-10-CM | POA: Diagnosis not present

## 2022-07-24 DIAGNOSIS — F411 Generalized anxiety disorder: Secondary | ICD-10-CM

## 2022-07-24 MED ORDER — HYDROXYZINE HCL 10 MG PO TABS: 10.0000 mg | ORAL_TABLET | Freq: Two times a day (BID) | ORAL | 3 refills | Status: DC | PRN

## 2022-07-24 MED ORDER — ESCITALOPRAM OXALATE 20 MG PO TABS: 20.0000 mg | ORAL_TABLET | Freq: Every day | ORAL | 3 refills | Status: DC

## 2022-07-24 NOTE — Progress Notes (Signed)
BH MD/PA/NP OP Progress Note        07/24/2022 2:43 PM Debra Herrera  MRN:  [id number]  Chief Complaint:  "I live in a toxic environment"    HPI: 42 year old female seen for follow up psychiatric evaluation. She has a psychiatric history of anxiety, OCD, and depression. She is currently prescribed Lexapro 20 mg daily and hydroxyzine 10 mg twice daily for anxiety.  Today she noted that current medication regimen is effective in managing her psychiatric conditions.  Today she is well groomed, pleasant, cooperative, engaged in conversation, and maintaining eye contact. She informed Probation officer she lives in a toxic environment. She reports that her husband is angry and demeans her. She informed Probation officer that she is attempting to save money so that she can leave. Patient reports that she continues to have an open relationship and finds comfort in her boyfriend. She informed Probation officer that's her anxiety and depression has somewhat improved since increasing Lexapro Today provider conducted a GAD 7 and patient 13, at her last visit she scored a 17. Provider also conducted a PHQ9 and patient scored an 8, at her last visit she scored a 23. She endorses adequate sleep and reduced appetite. Since her last visit she has lost 5 pounds. Today she denies SI/HI/VAH, mania, paranoia.    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  She reports that she finds enjoyment in her job.     No medication changed made today. Patient agreeable to continue medications as prescribed.  No other concerns at this time.     Visit Diagnosis:    ICD-10-CM   1. Recurrent major depressive disorder, in partial remission (HCC)  F33.41 escitalopram (LEXAPRO) 20 MG tablet    2. Generalized anxiety disorder  F41.1 hydrOXYzine (ATARAX) 10 MG tablet      Past Psychiatric History: Anxiety, OCD, and depression  Past Medical History:  Past Medical History:  Diagnosis Date   Anxiety    Depression     Headache(784.0)    Human papilloma virus    OCD (obsessive compulsive disorder)    TMJ arthralgia    Urinary tract infection     Past Surgical History:  Procedure Laterality Date   ADENOIDECTOMY     TONSILLECTOMY AND ADENOIDECTOMY      Family Psychiatric History: Brother Bipolar disorder, Mother OCD  Family History:  Family History  Problem Relation Age of Onset   Diabetes Father    Hypertension Father    Hypertension Mother    Other Neg Hx     Social History:  Social History   Socioeconomic History   Marital status: Married    Spouse name: Not on file   Number of children: Not on file   Years of education: Not on file   Highest education level: Not on file  Occupational History   Not on file  Tobacco Use   Smoking status: Never   Smokeless tobacco: Never  Vaping Use   Vaping Use: Never used  Substance and Sexual Activity   Alcohol use: No   Drug use: No   Sexual activity: Yes    Partners: Male  Other Topics Concern   Not on file  Social History Narrative   ** Merged History Encounter **       Social Determinants of Health   Financial Resource Strain: Not on file  Food Insecurity: Not on file  Transportation Needs: Not on file  Physical Activity: Not on file  Stress: Not  on file  Social Connections: Not on file    Allergies:  Allergies  Allergen Reactions   Benadryl [Diphenhydramine Hcl] Rash   Sulfa Antibiotics Rash    Metabolic Disorder Labs: No results found for: "HGBA1C", "MPG" No results found for: "PROLACTIN" No results found for: "CHOL", "TRIG", "HDL", "CHOLHDL", "VLDL", "LDLCALC" No results found for: "TSH"  Therapeutic Level Labs: No results found for: "LITHIUM" No results found for: "VALPROATE" No results found for: "CBMZ"  Current Medications: Current Outpatient Medications  Medication Sig Dispense Refill   azithromycin (ZITHROMAX) 250 MG tablet Take 1 tablet (250 mg total) by mouth daily. Take first 2 tablets together, then  1 every day until finished. 6 tablet 0   benzonatate (TESSALON) 100 MG capsule Take 1 capsule (100 mg total) by mouth every 8 (eight) hours as needed for cough. 21 capsule 0   escitalopram (LEXAPRO) 20 MG tablet Take 1 tablet (20 mg total) by mouth daily. 30 tablet 3   fexofenadine (ALLEGRA ALLERGY) 180 MG tablet Take 1 tablet (180 mg total) by mouth daily for 15 days. 15 tablet 0   hydrOXYzine (ATARAX) 10 MG tablet Take 1 tablet (10 mg total) by mouth 2 (two) times daily as needed. 60 tablet 3   levonorgestrel (MIRENA) 20 MCG/24HR IUD Mirena 20 mcg/24 hours (5 yrs) 52 mg intrauterine device  Take 1 device by intrauterine route.     meloxicam (MOBIC) 15 MG tablet TAKE 1 TABLET (15 MG TOTAL) BY MOUTH DAILY. 30 tablet 3   methylPREDNISolone (MEDROL DOSEPAK) 4 MG TBPK tablet 6 day dose pack - take as directed (Patient not taking: Reported on 03/01/2022) 21 tablet 0   Multiple Vitamin (MULTIVITAMIN) tablet Take 1 tablet by mouth daily.     No current facility-administered medications for this visit.     Musculoskeletal: Strength & Muscle Tone: within normal limits Gait & Station: normal Patient leans: N/A  Psychiatric Specialty Exam: Review of Systems  Blood pressure (!) 145/97, pulse 92, resp. rate 18, height 5\' 3"  (1.6 m), weight 203 lb 6.4 oz (92.3 kg), SpO2 99 %.Body mass index is 36.03 kg/m.  General Appearance: Well Groomed  Eye Contact:  Good  Speech:  Clear and Coherent and Normal Rate  Volume:  Normal  Mood:  Euthymic  Affect:  Appropriate and Congruent  Thought Process:  Coherent, Goal Directed and Linear  Orientation:  Full (Time, Place, and Person)  Thought Content: WDL and Logical   Suicidal Thoughts:  No  Homicidal Thoughts:  No  Memory:  Immediate;   Good Recent;   Good Remote;   Good  Judgement:  Good  Insight:  Good  Psychomotor Activity:  Normal  Concentration:  Concentration: Good and Attention Span: Good  Recall:  Good  Fund of Knowledge: Good  Language:  Good  Akathisia:  No  Handed:  Right  AIMS (if indicated): Not done  Assets:  Communication Skills Desire for Improvement Financial Resources/Insurance Housing Social Support  ADL's:  Intact  Cognition: WNL  Sleep:  Good   Screenings: GAD-7    Flowsheet Row Clinical Support from 07/24/2022 in [facility] Clinical Support from 04/24/2022 in [facility] Video Visit from 07/20/2021 in [facility] Video Visit from 04/27/2021 in [facility] Video Visit from 01/25/2021 in [facility]  Total GAD-7 Score 13 17 6 10 16       PHQ2-9    Flowsheet Row Clinical Support from  07/24/2022 in [facility] Clinical Support from 04/24/2022 in [facility] Video Visit from 07/20/2021 in [facility] Video Visit from 04/27/2021 in [facility] Video Visit from 01/25/2021 in [facility]  PHQ-2 Total Score 2 6 5  0 1  PHQ-9 Total Score 8 23 10 8 9       Flowsheet Row Clinical Support from 04/24/2022 in [facility] ED from 03/19/2022 in [facility] from 03/01/2022 in [facility])  C-SSRS RISK CATEGORY Low Risk No Risk No Risk        Assessment and Plan: Patient reports that she has stressors in her marriage but notes that she is able to cope with it. She reports that increasing Lexapro has improved her anxiety and depression. No medication changed made today. Patient agreeable to continue medications as prescribed.  1. Recurrent major depressive disorder, in partial remission (HCC) Continue- escitalopram (LEXAPRO) 20 MG tablet; Take 1 tablet (20 mg total) by mouth daily.  Dispense: 30 tablet; Refill: 3  2. Generalized anxiety  disorder  Continue- hydrOXYzine (ATARAX) 10 MG tablet; Take 1 tablet (10 mg total) by mouth 2 (two) times daily as needed.  Dispense: 60 tablet; Refill: 3     Follow up in 3 months.    Salley Slaughter, NP 07/24/2022, 2:43 PM

## 2022-07-31 ENCOUNTER — Ambulatory Visit (INDEPENDENT_AMBULATORY_CARE_PROVIDER_SITE_OTHER): Payer: No Payment, Other | Admitting: Clinical

## 2022-07-31 DIAGNOSIS — F3341 Major depressive disorder, recurrent, in partial remission: Secondary | ICD-10-CM | POA: Diagnosis not present

## 2022-07-31 NOTE — Progress Notes (Signed)
   THERAPIST PROGRESS NOTE  Session Time: 45 minutes  Participation Level: Active  Behavioral Response: CasualAlertEuthymic  Type of Therapy: Individual Therapy  Treatment Goals addressed: client will practice problem solving skills 3 times per week for the next 4 weeks  ProgressTowards Goals: Progressing  Interventions: CBT and Supportive  Summary:  Debra Herrera is a 42 y.o. female who presents for the appointment oriented times five, appropriately dressed, and friendly. Client denied hallucinations and delusions. Client reported she is doing fairly well. Client reported things have been not so good for her daughter who is having constant G.I issues. Client reported her daughter has been at home a lot. Client reported her son has been doing well. 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 Client reported she has been spending time with her guy friends daughter who she helps to look after. Client reported  she has also been spending time with her friends. Client reported she recently reconnected with people she'd known since middle school. Client reported they are planning to potentially do a reunion cookout this summer. Client reported otherwise "nothing too crazy" has been going on.  Evidence of progress towards goal:  client reported doing behavioral activation at least 2x per week spending time with a friend.  Suicidal/Homicidal: Nowithout intent/plan  Therapist Response:  Therapist began the appointment asking the client how she has been doing since last. Therapist used CBT to engage using active listening and positive emotional support. Therapist used CBT to engage with the client while she discussed her home, social and work life. Therapist used CBT to positively reinforce the clients positive support outlets to alleviate negative emotions. Therapist used CBT ask the client to identify her progress with frequency of use with coping skills with  continued practice in her daily activity.    Therapist assigned the client homework to practice self care.   Plan: Return again in 3 weeks.  Diagnosis: recurrent major depressive disorder, in partial remission  Collaboration of Care: Patient refused AEB none requested by the client.  Patient/Guardian was advised Release of Information must be obtained prior to any record release in order to collaborate their care with an outside provider. Patient/Guardian was advised if they have not already done so to contact the registration department to sign all necessary forms in order for [place] to release information regarding their care.   Consent: Patient/Guardian gives verbal consent for treatment and assignment of benefits for services provided during this visit. Patient/Guardian expressed understanding and agreed to proceed.   [place], LCSW 07/31/2022

## 2022-08-14 ENCOUNTER — Ambulatory Visit (HOSPITAL_COMMUNITY): Payer: No Payment, Other | Admitting: Clinical

## 2022-09-11 ENCOUNTER — Ambulatory Visit (INDEPENDENT_AMBULATORY_CARE_PROVIDER_SITE_OTHER): Payer: No Payment, Other | Admitting: Clinical

## 2022-09-11 DIAGNOSIS — F411 Generalized anxiety disorder: Secondary | ICD-10-CM

## 2022-09-11 NOTE — Progress Notes (Unsigned)
   THERAPIST PROGRESS NOTE  Session Time: 45 minutes  Participation Level: Active  Behavioral Response: CasualAlertIrritable  Type of Therapy: Individual Therapy  Treatment Goals addressed: client will practice problem solving skills 3 times per week for the next 4 weeks  ProgressTowards Goals: Progressing  Interventions: CBT and Supportive  Summary:  Debra Herrera is a 42 y.o. female who presents for the scheduled appointment oriented x 5, appropriately dressed, friendly.  Client denied hallucinations and delusions. Client reported she has been feeling some stress as it relates to parenting in the home with her husband.  Client reported over the past weekend they celebrated their daughter's birthday.  Client reported her daughter has had a history of defiant and irritable behaviors. Client reported her daughter talks back and has temper tantrums in front of others as well when she does not have her way. Client reported she solely paid for her daughters birthday with no help from her husband. Client reported she has no support from her husband when it comes to agreeing to support her disciplinary actions. Client reported her husband has told her to "stop being a horrible person" for being mean to their daughter when trying to discipline her. Client reported her husband complains about money often and makes the home environment negative. Client reported she tries not to spend so much time at home. Client reported she has her daughter in therapy. Evidence of progress towards goal:  client reported using 1 positive coping skill of using positive social support to help with depressive symptoms.  Suicidal/Homicidal: Nowithout intent/plan  Therapist Response:  Therapist began the appointment asking the client how she has been doing since last seen. Therapist used CBT to engage using active listening and positive emotional support. Therapist used CBT to engage and give the client time  to discuss her stressors and how she is coping with them. Therapist used CBT to reinforce positive coping skills. Therapist used CBT ask the client to identify her progress with frequency of use with coping skills with continued practice in her daily activity.    Therapist assigned the client homework to practice self care.    Plan: Return again in 3 weeks.  Diagnosis: generalized anxiety disorder  Collaboration of Care: Patient refused AEB none requested by the client.  Patient/Guardian was advised Release of Information must be obtained prior to any record release in order to collaborate their care with an outside provider. Patient/Guardian was advised if they have not already done so to contact the registration department to sign all necessary forms in order for [place] to release information regarding their care.   Consent: Patient/Guardian gives verbal consent for treatment and assignment of benefits for services provided during this visit. Patient/Guardian expressed understanding and agreed to proceed.   [place], LCSW 09/11/2022

## 2022-09-13 ENCOUNTER — Encounter (HOSPITAL_COMMUNITY): Payer: Self-pay

## 2022-10-16 ENCOUNTER — Ambulatory Visit (INDEPENDENT_AMBULATORY_CARE_PROVIDER_SITE_OTHER): Payer: No Payment, Other | Admitting: Clinical

## 2022-10-16 DIAGNOSIS — F411 Generalized anxiety disorder: Secondary | ICD-10-CM | POA: Diagnosis not present

## 2022-10-18 ENCOUNTER — Encounter (HOSPITAL_COMMUNITY): Payer: Self-pay | Admitting: Psychiatry

## 2022-10-18 ENCOUNTER — Ambulatory Visit (INDEPENDENT_AMBULATORY_CARE_PROVIDER_SITE_OTHER): Payer: No Payment, Other | Admitting: Psychiatry

## 2022-10-18 DIAGNOSIS — F3341 Major depressive disorder, recurrent, in partial remission: Secondary | ICD-10-CM

## 2022-10-18 DIAGNOSIS — F411 Generalized anxiety disorder: Secondary | ICD-10-CM

## 2022-10-18 MED ORDER — ESCITALOPRAM OXALATE 20 MG PO TABS: 20.0000 mg | ORAL_TABLET | Freq: Every day | ORAL | 3 refills | Status: DC

## 2022-10-18 MED ORDER — HYDROXYZINE HCL 10 MG PO TABS: 10.0000 mg | ORAL_TABLET | Freq: Three times a day (TID) | ORAL | 3 refills | Status: DC | PRN
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
## 2022-10-29 ENCOUNTER — Emergency Department (HOSPITAL_COMMUNITY)
Admission: EM | Admit: 2022-10-29 | Discharge: 2022-10-29 | Disposition: A | Discharge status: Home / Self Care | Payer: Self-pay | Attending: Emergency Medicine | Admitting: Emergency Medicine

## 2022-10-29 ENCOUNTER — Encounter (HOSPITAL_COMMUNITY): Payer: Self-pay | Admitting: Emergency Medicine

## 2022-10-29 ENCOUNTER — Other Ambulatory Visit: Payer: Self-pay

## 2022-10-29 DIAGNOSIS — Z91040 Latex allergy status: Secondary | ICD-10-CM | POA: Insufficient documentation

## 2022-10-29 DIAGNOSIS — R55 Syncope and collapse: Secondary | ICD-10-CM | POA: Insufficient documentation

## 2022-10-29 LAB — CBC WITH DIFFERENTIAL/PLATELET
Abs Immature Granulocytes: 0.03 10*3/uL (ref 0.00–0.07)
Basophils Absolute: 0 10*3/uL (ref 0.0–0.1)
Basophils Relative: 0 %
Eosinophils Absolute: 0.1 10*3/uL (ref 0.0–0.5)
Eosinophils Relative: 1 %
HCT: 40.9 % (ref 36.0–46.0)
Hemoglobin: 13.4 g/dL (ref 12.0–15.0)
Immature Granulocytes: 0 %
Lymphocytes Relative: 25 %
Lymphs Abs: 2.2 10*3/uL (ref 0.7–4.0)
MCH: 29.8 pg (ref 26.0–34.0)
MCHC: 32.8 g/dL (ref 30.0–36.0)
MCV: 90.9 fL (ref 80.0–100.0)
Monocytes Absolute: 0.4 10*3/uL (ref 0.1–1.0)
Monocytes Relative: 4 %
Neutro Abs: 5.9 10*3/uL (ref 1.7–7.7)
Neutrophils Relative %: 70 %
Platelets: 362 10*3/uL (ref 150–400)
RBC: 4.5 MIL/uL (ref 3.87–5.11)
RDW: 13.1 % (ref 11.5–15.5)
WBC: 8.6 10*3/uL (ref 4.0–10.5)
nRBC: 0 % (ref 0.0–0.2)

## 2022-10-29 LAB — BASIC METABOLIC PANEL
Anion gap: 10 (ref 5–15)
BUN: 14 mg/dL (ref 6–20)
CO2: 25 mmol/L (ref 22–32)
Calcium: 9.6 mg/dL (ref 8.9–10.3)
Chloride: 101 mmol/L (ref 98–111)
Creatinine, Ser: 0.75 mg/dL (ref 0.44–1.00)
GFR, Estimated: >60 mL/min (ref >60)
Glucose, Bld: 110 mg/dL — ABNORMAL HIGH (ref 70–99)
Potassium: 3.7 mmol/L (ref 3.5–5.1)
Sodium: 136 mmol/L (ref 135–145)

## 2022-10-29 LAB — CBG MONITORING, ED: Glucose-Capillary: 91 mg/dL (ref 70–99)

## 2022-10-29 MED ORDER — ACETAMINOPHEN 325 MG PO TABS
650.0000 mg | ORAL_TABLET | Freq: Four times a day (QID) | ORAL | Status: DC | PRN
  Administered 2022-10-29: 650 mg via ORAL
  Filled 2022-10-29: qty 2

## 2022-10-29 NOTE — ED Provider Notes (Signed)
[facility] AT [facility] Provider Note   CSN: [id number] Arrival date & time: 10/29/22  1907     History  Chief Complaint  Patient presents with   Near Syncope    Debra Herrera is a 42 y.o. female who presents to ED after multiple syncopal episodes. Patient's found out that her husband committed suicide earlier today - which is when the syncopal episodes started. Friend at bedside endorsing no head trauma with all syncopal events. Patient now complaining of headache.   Denies chest pain, SOB, fevers, nausea, vomiting, diarrhea, seizure activity   Near Syncope       Home Medications Prior to Admission medications   Medication Sig Start Date End Date Taking? Authorizing Provider  escitalopram (LEXAPRO) 20 MG tablet Take 1 tablet (20 mg total) by mouth daily. Patient taking differently: Take 20 mg by mouth at bedtime. 10/18/22  Yes Toy Cookey E, NP  hydrOXYzine (ATARAX) 10 MG tablet Take 1 tablet (10 mg total) by mouth 3 (three) times daily as needed. Patient taking differently: Take 10-20 mg by mouth at bedtime. 10/18/22  Yes Toy Cookey E, NP  meloxicam (MOBIC) 15 MG tablet TAKE 1 TABLET (15 MG TOTAL) BY MOUTH DAILY. Patient taking differently: Take 15 mg by mouth daily as needed (for headaches or pain). 01/15/22  Yes Hyatt, Max T, DPM  Multiple Vitamin (MULTIVITAMIN) tablet Take 1 tablet by mouth daily with breakfast.   Yes [provider]  TYLENOL 500 MG tablet Take 500-1,000 mg by mouth every 6 (six) hours as needed for mild pain (or headaches).   Yes [provider]  azithromycin (ZITHROMAX) 250 MG tablet Take 1 tablet (250 mg total) by mouth daily. Take first 2 tablets together, then 1 every day until finished. Patient not taking: Reported on 10/29/2022 03/19/22   Trevor Iha, FNP  benzonatate (TESSALON) 100 MG capsule Take 1 capsule (100 mg total) by mouth every 8 (eight) hours as needed for  cough. Patient not taking: Reported on 10/29/2022 03/01/22   Gustavus Bryant, FNP  fexofenadine [facility] ALLERGY) 180 MG tablet Take 1 tablet (180 mg total) by mouth daily for 15 days. Patient not taking: Reported on 10/29/2022 03/19/22 10/29/22  Trevor Iha, FNP  levonorgestrel (MIRENA) 20 MCG/24HR IUD 1 each by Intrauterine route once.    [provider]  methylPREDNISolone (MEDROL DOSEPAK) 4 MG TBPK tablet 6 day dose pack - take as directed Patient not taking: Reported on 10/29/2022 08/16/20   Ernestene Kiel T, DPM      Allergies    Benadryl [diphenhydramine hcl], Latex, and Sulfa antibiotics    Review of Systems   Review of Systems  Cardiovascular:  Positive for near-syncope.    Physical Exam Updated Vital Signs BP 131/75   Pulse 86   Temp 98.7 F (37.1 C) (Oral)   Resp 16   Ht 5\' 3"  (1.6 m)   Wt 94.7 kg   SpO2 99%   BMI 36.98 kg/m  Physical Exam Vitals and nursing note reviewed.  Constitutional:      General: She is not in acute distress.    Appearance: She is not ill-appearing or toxic-appearing.  HENT:     Head: Normocephalic and atraumatic.     Right Ear: Tympanic membrane, ear canal and external ear normal.     Left Ear: Tympanic membrane, ear canal and external ear normal.     Mouth/Throat:     Mouth: Mucous membranes are moist.  Eyes:  General: No scleral icterus.       Right eye: No discharge.        Left eye: No discharge.     Conjunctiva/sclera: Conjunctivae normal.  Cardiovascular:     Rate and Rhythm: Normal rate and regular rhythm.     Pulses: Normal pulses.     Heart sounds: Normal heart sounds. No murmur heard. Pulmonary:     Effort: Pulmonary effort is normal. No respiratory distress.     Breath sounds: Normal breath sounds. No wheezing, rhonchi or rales.  Abdominal:     Tenderness: There is no abdominal tenderness.  Skin:    General: Skin is warm and dry.     Findings: No rash.  Neurological:     General: No focal deficit present.      Mental Status: She is alert. Mental status is at baseline.  Psychiatric:        Mood and Affect: Mood normal.     ED Results / Procedures / Treatments   Labs (all labs ordered are listed, but only abnormal results are displayed) Labs Reviewed  BASIC METABOLIC PANEL - Abnormal; Notable for the following components:      Result Value   Glucose, Bld 110 (*)    All other components within normal limits  CBC WITH DIFFERENTIAL/PLATELET  CBG MONITORING, ED  I-STAT BETA HCG BLOOD, ED ([facility], [facility], [facility] ONLY)    EKG None  Radiology No results found.  Procedures Procedures    Medications Ordered in ED Medications  acetaminophen (TYLENOL) tablet 650 mg (650 mg Oral Given 10/29/22 2145)    ED Course/ Medical Decision Making/ A&P                             Medical Decision Making Amount and/or Complexity of Data Reviewed Labs: ordered. ECG/medicine tests: ordered.  Risk OTC drugs.   This patient presents to the ED for concern of syncope, this involves an extensive number of treatment options, and is a complaint that carries with it a high risk of complications and morbidity.  The differential diagnosis includes CVA, ICH, intracranial mass, critical dehydration, endocrine abnormality, sepsis/infection, electrolyte abnormality, cardiac arrhythmia.   Co morbidities that complicate the patient evaluation  none   Lab Tests:  I Ordered, and personally interpreted labs.  The pertinent results include:   -CBG: 91 -BMP: no concern for electrolyte abnormality; no concern for kidney damage -CBC: No concern for anemia or leukocytosis    Cardiac Monitoring: / EKG:  The patient was maintained on a cardiac monitor.  I personally viewed and interpreted the cardiac monitored which showed an underlying rhythm of: normal sinus rhythm without ST changes or arrhythmias    Problem List / ED Course / Critical interventions / Medication management  Patient presents to ED after syncopal  events today. Patient complaining of headache in ED that is similar to her past migraines. Friend was present for syncopal events and claims that patient did not hit her head.  Gave patient tylenol for headache which resolved symptom. Patient ambulatory. Lab work reassuring. EKG without ST changes or arrhythmias. Patient requesting to go home, saying that she thinks the syncopal events happened because of all the stress from today. I shared lab and EKG results with patient and agreed that she does not need inpatient treatment at this time. Recommended patient establish primary care - provided information for community clinic. I have reviewed the patients home medicines and have  made adjustments as needed Patient was given return precautions. Patient stable for discharge at this time.  Patient verbalized understanding of plan.  Ddx: these are considered less likely due to history of present illness and physical exam -CVA/ICH/intracranial mass: patient without neurodeficits; no history of seizure -Critical dehydration: BMP/CMP without concern -Sepsis/infection: SIRs criteria not met; patient afebrile without infectious symptoms -Electrolyte abnormality: BMP/CMP without concern  -Cardiac arrhythmia: EKG shows NSR without acute ST changes   Social Determinants of Health:  none          Final Clinical Impression(s) / ED Diagnoses Final diagnoses:  Vasovagal syncope    Rx / DC Orders ED Discharge Orders     None         Margarita Rana 10/29/22 2343    Bethann Berkshire, MD 10/30/22 1307

## 2022-10-29 NOTE — ED Triage Notes (Addendum)
Patient brought in by EMS from a friends house. Patients husband committed suicide 2 hours ago. EMS reports multiple syncopal episodes. Patient was lowered to the ground buy friend. Denies head injury.

## 2022-10-29 NOTE — Discharge Instructions (Signed)
Im glad you are feeling better. Blood work was reassuring. EKG was also reassuring I recommend establishing primary care with the clinic information that I have provided on this discharge paperwork and meeting with your primary care within the next week.  Seek emergency care if experiencing new or worsening symptoms.

## 2022-10-30 ENCOUNTER — Ambulatory Visit (INDEPENDENT_AMBULATORY_CARE_PROVIDER_SITE_OTHER): Payer: No Payment, Other | Admitting: Clinical

## 2022-10-30 DIAGNOSIS — F4321 Adjustment disorder with depressed mood: Secondary | ICD-10-CM | POA: Diagnosis not present
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
## 2022-11-22 ENCOUNTER — Ambulatory Visit (INDEPENDENT_AMBULATORY_CARE_PROVIDER_SITE_OTHER): Payer: No Payment, Other | Admitting: Clinical

## 2022-11-22 DIAGNOSIS — F4321 Adjustment disorder with depressed mood: Secondary | ICD-10-CM

## 2022-11-23 NOTE — Progress Notes (Signed)
   THERAPIST PROGRESS NOTE  Session Time: 45 minutes  Participation Level: Active  Behavioral Response: CasualAlertDepressed  Type of Therapy: Individual Therapy  Treatment Goals addressed: client will practice problem solving skills 3 times per week for the next 4 weeks  ProgressTowards Goals: Progressing  Interventions: CBT and Supportive  Summary:  Debra Herrera is a 42 y.o. female who presents for the scheduled appointment oriented times five, appropriately dressed and friendly. Client denied hallucinations and delusions. Client reported things have been chaotic and stressful. Client reported speaking with her brother in law she has learned more about her late husband and seen pictures of him that were never known to her. Client reported her husband had been depressed since his adolescent years. Client reported her husbands parents are mad at her and blame her for why he killed himself. Client reported they do not have an understanding of their marriage. Client reported his parents owned their home and quickly sold off things so they could sell the home. Client reported she is still staying with her friend. Client reported she has been thinking about how to pay for bills and where she would move to. Client reported she has been fixated on everyone else's problems and not on hers. Client reported there has been a lot of legal paperwork to get done.Client reported she still ponders why he would kills himself and leave behind his family. Client reported she never wanted him to feel that way. Client reported she accepts that he is gone but it doesn't feel real. Evidence of progress towards goal:  client reported 1 positive of using family support to help with her grieving process.   Suicidal/Homicidal: Nowithout intent/plan  Therapist Response:  Therapist began the appointment asking the client how she has been doing since last seen. Therapist used CBT to engage using active  listening and positive emotional support. Therapist used CBT to engage and ask the client about her symptoms of grief and depression. Therapist used CBT to discuss the grieving cycle. Therapist used CBT to discuss self care and prioritization of needs during times of transition. Therapist used CBT ask the client to identify her progress with frequency of use with coping skills with continued practice in her daily activity.    Therapist assigned the client homework to practice self care.   Plan: Return again in 3 weeks.  Diagnosis: grief  Collaboration of Care: Patient refused AEB none requested by the client.  Patient/Guardian was advised Release of Information must be obtained prior to any record release in order to collaborate their care with an outside provider. Patient/Guardian was advised if they have not already done so to contact the registration department to sign all necessary forms in order for [place] to release information regarding their care.   Consent: Patient/Guardian gives verbal consent for treatment and assignment of benefits for services provided during this visit. Patient/Guardian expressed understanding and agreed to proceed.   Neena Rhymes Fidela Cieslak, LCSW 11/22/2022

## 2022-12-12 ENCOUNTER — Ambulatory Visit (HOSPITAL_COMMUNITY): Payer: No Payment, Other | Admitting: Clinical

## 2022-12-19 ENCOUNTER — Ambulatory Visit (INDEPENDENT_AMBULATORY_CARE_PROVIDER_SITE_OTHER): Payer: Medicaid Other | Admitting: Clinical

## 2022-12-19 DIAGNOSIS — F4321 Adjustment disorder with depressed mood: Secondary | ICD-10-CM | POA: Diagnosis not present

## 2022-12-20 NOTE — Progress Notes (Signed)
   THERAPIST PROGRESS NOTE  Session Time: 30 minutes  Participation Level: Active  Behavioral Response: CasualAlertEuthymic  Type of Therapy: Individual Therapy  Treatment Goals addressed: client will score less than a 5 on the GAD7 questionnaire   ProgressTowards Goals: Progressing  Interventions: CBT and Supportive  Summary:  Debra Herrera is a 41 y.o. female who presents for the scheduled appointment oriented times five, appropriately dressed and friendly. Client denied hallucinations and delusions.  Client reported on today she is doing fairly okay. Client reported she is happy that she was approved for a rental home. Client reported she will be sharing the home with her friend she is currently staying with along with their children. Client reported she has some money coming in to help pay bills from her late husbands life insurance. Client reported she received word from the sheriff that she could gather her husbands belongings. Client reported she was not bale to get into his phone yet. Client reported deciding whether or not she wanted to see what he had in there because it could give her answers for how he was really thinking. Client reported her husbands parents have been mean making comments that she is not family to them. Client reported they keep a relationship with the kids. Client reported her brother in law and his wife are however very supportive. Client reported she has been busy and not given thought to grief as much but she does have triggers such as something giving her the recall of hearing his voice. Evidence of progress towards goal:  client reported    Suicidal/Homicidal: Nowithout intent/plan  Therapist Response:  Therapist began the appointment asking the client how she has been doing. Therapist used cbt to engage using active listening and positive emotional support. Therapist used cbt to engage and ask the client to discuss positive and/ or negative  changes that have occurred. Therapist used cbt to ask the client to describe any depressive symptoms or grief symptoms. Therapist used cbt to discuss self care and utilizing support. Therapist used CBT ask the client to identify her progress with frequency of use with coping skills with continued practice in her daily activity.    Therapist assigned the client homework to practice self care.   Plan: Return again in 4 weeks.  Diagnosis: grief  Collaboration of Care: Patient refused AEB none requested by the client.  Patient/Guardian was advised Release of Information must be obtained prior to any record release in order to collaborate their care with an outside provider. Patient/Guardian was advised if they have not already done so to contact the registration department to sign all necessary forms in order for [place] to release information regarding their care.   Consent: Patient/Guardian gives verbal consent for treatment and assignment of benefits for services provided during this visit. Patient/Guardian expressed understanding and agreed to proceed.   Neena Rhymes Reika Callanan, LCSW 12/19/2022

## 2022-12-24 ENCOUNTER — Ambulatory Visit (INDEPENDENT_AMBULATORY_CARE_PROVIDER_SITE_OTHER): Payer: Medicaid Other | Admitting: Psychiatry

## 2022-12-24 ENCOUNTER — Encounter (HOSPITAL_COMMUNITY): Payer: Self-pay | Admitting: Psychiatry

## 2022-12-24 DIAGNOSIS — F3341 Major depressive disorder, recurrent, in partial remission: Secondary | ICD-10-CM | POA: Diagnosis not present

## 2022-12-24 DIAGNOSIS — F411 Generalized anxiety disorder: Secondary | ICD-10-CM | POA: Diagnosis not present

## 2022-12-24 MED ORDER — ESCITALOPRAM OXALATE 20 MG PO TABS: 20.0000 mg | ORAL_TABLET | Freq: Every day | ORAL | 3 refills | Status: DC

## 2022-12-24 MED ORDER — HYDROXYZINE HCL 10 MG PO TABS: 10.0000 mg | ORAL_TABLET | Freq: Three times a day (TID) | ORAL | 3 refills | Status: DC | PRN
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
## 2023-01-15 ENCOUNTER — Emergency Department (HOSPITAL_BASED_OUTPATIENT_CLINIC_OR_DEPARTMENT_OTHER)
Admission: EM | Admit: 2023-01-15 | Discharge: 2023-01-15 | Disposition: A | Discharge status: Home / Self Care | Payer: Medicaid Other | Source: Home / Self Care | Attending: Emergency Medicine | Admitting: Emergency Medicine

## 2023-01-15 ENCOUNTER — Encounter (HOSPITAL_BASED_OUTPATIENT_CLINIC_OR_DEPARTMENT_OTHER): Payer: Self-pay

## 2023-01-15 ENCOUNTER — Emergency Department (HOSPITAL_BASED_OUTPATIENT_CLINIC_OR_DEPARTMENT_OTHER): Payer: Medicaid Other | Admitting: Radiology

## 2023-01-15 DIAGNOSIS — W109XXA Fall (on) (from) unspecified stairs and steps, initial encounter: Secondary | ICD-10-CM | POA: Diagnosis not present

## 2023-01-15 DIAGNOSIS — Z91040 Latex allergy status: Secondary | ICD-10-CM | POA: Diagnosis not present

## 2023-01-15 DIAGNOSIS — S92511A Displaced fracture of proximal phalanx of right lesser toe(s), initial encounter for closed fracture: Secondary | ICD-10-CM | POA: Diagnosis not present

## 2023-01-15 DIAGNOSIS — S99921A Unspecified injury of right foot, initial encounter: Secondary | ICD-10-CM | POA: Diagnosis present

## 2023-01-15 MED ORDER — MELOXICAM 15 MG PO TABS: 15.0000 mg | ORAL_TABLET | Freq: Every day | ORAL | 0 refills | Status: AC

## 2023-01-15 MED ORDER — OXYCODONE-ACETAMINOPHEN 5-325 MG PO TABS
1.0000 | ORAL_TABLET | Freq: Once | ORAL | Status: AC
  Administered 2023-01-15: 1 via ORAL
  Filled 2023-01-15: qty 1

## 2023-01-15 NOTE — Discharge Instructions (Addendum)
You are seen today with injury to your right toe.  You do have a fracture.  Maintain the buddy tape and hard soled shoe.  You may apply ice.  Take ibuprofen or Tylenol for pain.

## 2023-01-15 NOTE — ED Notes (Signed)
Patient verbalizes understanding of discharge instructions. Opportunity for questioning and answers were provided. Armband removed by staff, pt discharged from ED. Ambulated out to lobby with spouse  

## 2023-01-15 NOTE — ED Provider Notes (Signed)
[facility] EMERGENCY DEPARTMENT AT [facility] Provider Note   CSN: [id number] Arrival date & time: 01/15/23  0202     History  Chief Complaint  Patient presents with   Toe Injury    Debra Herrera is a 42 y.o. female.  HPI     This is a 42 year old female who presents with a right fourth toe injury.  Patient reports that she was carrying boxes down the steps when she missed stepped tripping and falling.  She reports pain to the right fourth toe.  She states that appears to be pointing in the wrong direction.  She has not taken anything for her pain.  She denies hitting her head or loss of consciousness.  Denies other injury.  Home Medications Prior to Admission medications   Medication Sig Start Date End Date Taking? Authorizing Provider  azithromycin (ZITHROMAX) 250 MG tablet Take 1 tablet (250 mg total) by mouth daily. Take first 2 tablets together, then 1 every day until finished. Patient not taking: Reported on 10/29/2022 03/19/22   Trevor Iha, FNP  benzonatate (TESSALON) 100 MG capsule Take 1 capsule (100 mg total) by mouth every 8 (eight) hours as needed for cough. Patient not taking: Reported on 10/29/2022 03/01/22   Gustavus Bryant, FNP  escitalopram (LEXAPRO) 20 MG tablet Take 1 tablet (20 mg total) by mouth daily. 12/24/22   Shanna Cisco, NP  fexofenadine (ALLEGRA ALLERGY) 180 MG tablet Take 1 tablet (180 mg total) by mouth daily for 15 days. Patient not taking: Reported on 10/29/2022 03/19/22 10/29/22  Trevor Iha, FNP  hydrOXYzine (ATARAX) 10 MG tablet Take 1 tablet (10 mg total) by mouth 3 (three) times daily as needed. 12/24/22   Shanna Cisco, NP  levonorgestrel (MIRENA) 20 MCG/24HR IUD 1 each by Intrauterine route once.    [provider]  meloxicam (MOBIC) 15 MG tablet TAKE 1 TABLET (15 MG TOTAL) BY MOUTH DAILY. Patient taking differently: Take 15 mg by mouth daily as needed (for headaches or pain). 01/15/22   Hyatt, Max T, DPM   methylPREDNISolone (MEDROL DOSEPAK) 4 MG TBPK tablet 6 day dose pack - take as directed Patient not taking: Reported on 10/29/2022 08/16/20   Ernestene Kiel T, DPM  Multiple Vitamin (MULTIVITAMIN) tablet Take 1 tablet by mouth daily with breakfast.    [provider]  TYLENOL 500 MG tablet Take 500-1,000 mg by mouth every 6 (six) hours as needed for mild pain (or headaches).    [provider]      Allergies    Benadryl [diphenhydramine hcl], Latex, and Sulfa antibiotics    Review of Systems   Review of Systems  Constitutional:  Negative for fever.  Respiratory:  Negative for shortness of breath.   Cardiovascular:  Negative for chest pain.  Musculoskeletal:        Toe pain  All other systems reviewed and are negative.   Physical Exam Updated Vital Signs BP (!) 128/94 (BP Location: Left Arm)   Temp 98.4 F (36.9 C) (Oral)   Resp 17   LMP 12/25/2022   SpO2 100%  Physical Exam Vitals and nursing note reviewed.  Constitutional:      Appearance: She is well-developed. She is obese. She is not ill-appearing.  HENT:     Head: Normocephalic and atraumatic.  Eyes:     Pupils: Pupils are equal, round, and reactive to light.  Cardiovascular:     Rate and Rhythm: Normal rate and regular rhythm.  Pulmonary:  Effort: Pulmonary effort is normal. No respiratory distress.  Abdominal:     Palpations: Abdomen is soft.  Musculoskeletal:     Cervical back: Neck supple.     Comments: Focused examination of the right foot with tenderness to palpation to the right fourth digit, no obvious dislocation, there is slight ecchymosis, 2+ DP pulse  Skin:    General: Skin is warm and dry.  Neurological:     Mental Status: She is alert and oriented to person, place, and time.  Psychiatric:        Mood and Affect: Mood normal.     ED Results / Procedures / Treatments   Labs (all labs ordered are listed, but only abnormal results are displayed) Labs Reviewed - No data to  display  EKG None  Radiology DG Toe 4th Right  Result Date: 01/15/2023 CLINICAL DATA:  Fall EXAM: RIGHT FOURTH TOE COMPARISON:  None Available. FINDINGS: Minimally displaced oblique fracture of the midshaft of the right fourth toe proximal phalanx. IMPRESSION: Minimally displaced oblique fracture of the midshaft of the right fourth toe proximal phalanx. Electronically Signed   By: Deatra Robinson M.D.   On: 01/15/2023 03:09    Procedures Procedures    Medications Ordered in ED Medications  oxyCODONE-acetaminophen (PERCOCET/ROXICET) 5-325 MG per tablet 1 tablet (1 tablet Oral Given 01/15/23 0252)    ED Course/ Medical Decision Making/ A&P                             Medical Decision Making Amount and/or Complexity of Data Reviewed Radiology: ordered.  Risk Prescription drug management.   This patient presents to the ED for concern of toe pain, this involves an extensive number of treatment options, and is a complaint that carries with it a high risk of complications and morbidity.  I considered the following differential and admission for this acute, potentially life threatening condition.  The differential diagnosis includes fracture, dislocation, sprain  MDM:    This is a 42 year old female who presents with toe injury.  She is nontoxic and vital signs are reassuring.  X-rays obtained of the right fourth digit show an oblique fracture that is minimally displaced.  Will buddy tape and provided with a postop shoe.  Recommend ice, Tylenol, Motrin as needed for pain.  Orthopedic follow-up provided.  (Labs, imaging, consults)  Labs: I Ordered, and personally interpreted labs.  The pertinent results include: None  Imaging Studies ordered: I ordered imaging studies including x-ray I independently visualized and interpreted imaging. I agree with the radiologist interpretation  Additional history obtained from chart review.  External records from outside source obtained and  reviewed including prior evaluations  Cardiac Monitoring: The patient was not maintained on a cardiac monitor.  If on the cardiac monitor, I personally viewed and interpreted the cardiac monitored which showed an underlying rhythm of: N/A  Reevaluation: After the interventions noted above, I reevaluated the patient and found that they have :stayed the same  Social Determinants of Health:  lives independently  Disposition: Discharge  Co morbidities that complicate the patient evaluation  Past Medical History:  Diagnosis Date   Anxiety    Depression    Headache(784.0)    Human papilloma virus    OCD (obsessive compulsive disorder)    TMJ arthralgia    Urinary tract infection      Medicines Meds ordered this encounter  Medications   oxyCODONE-acetaminophen (PERCOCET/ROXICET) 5-325 MG per tablet 1 tablet  I have reviewed the patients home medicines and have made adjustments as needed  Problem List / ED Course: Problem List Items Addressed This Visit   None Visit Diagnoses     Closed displaced fracture of proximal phalanx of lesser toe of right foot, initial encounter    -  Primary                   Final Clinical Impression(s) / ED Diagnoses Final diagnoses:  Closed displaced fracture of proximal phalanx of lesser toe of right foot, initial encounter    Rx / DC Orders ED Discharge Orders     None         Shon Baton, MD 01/15/23 [id number]

## 2023-01-15 NOTE — ED Triage Notes (Signed)
Patient was carrying boxes down stairs, missed a step and fell. Fourth toe of  right foot is hurting.

## 2023-02-19 ENCOUNTER — Ambulatory Visit (INDEPENDENT_AMBULATORY_CARE_PROVIDER_SITE_OTHER): Payer: Medicaid Other | Admitting: Clinical

## 2023-02-19 DIAGNOSIS — F3341 Major depressive disorder, recurrent, in partial remission: Secondary | ICD-10-CM

## 2023-02-19 NOTE — Progress Notes (Signed)
   THERAPIST PROGRESS NOTE  Session Time: 45 minutes  Participation Level: Active  Behavioral Response: CasualAlertEuthymic  Type of Therapy: Individual Therapy  Treatment Goals addressed: Catricia will practice problem solving skills 3 times per week for the next 4 weeks.   ProgressTowards Goals: Progressing  Interventions: CBT and Supportive  Summary:  Sherease Barnhouse is a 42 y.o. female who presents for the scheduled appointment oriented times five, appropriately dressed and friendly. Client denied hallucinations and delusions. Client reported she is doing well. Client reported she and the kids are doing well. Client reported the kids have started school. Client reported she feels like life has been "fantastic". Client reported she thinks it is because she is able to see a happy family dynamic with her friends and the kids living together. Client reported she has found that her kids have called her friend whom they live with "dad" and labeled him as such with the school. Client reported she imagined it could be a possibility but was surprised when it happened. Client reported she is ultimately fine with it and talked to them about it. Client reported she still has her days of thinking about Harrold Donath, her deceased husband. Client reported overall she thinks she is dealing okay with grief since his passing. Client reported she has some of his ashes which she keeps in a special place in the house.  Client reported otherwise she is doing well. Evidence of progress towards goal:  client reported 1 positive of working through symptoms of grief.  Suicidal/Homicidal: Nowithout intent/plan  Therapist Response:  Therapist began the appointment asking the client how she has been doing. Therapist used CBT to engage using active listening and positive emotional support. Therapist used CBT to engage and give the client time to discuss her thoughts and feelings about psychosocial stressors  and adjustment to a new routine. Therapist used CBT to address the client open-ended questions about her progression through grief and her new routines. Therapist used CBT ask the client to identify her progress with frequency of use with coping skills with continued practice in her daily activity.    Therapist assigned client homework to practice self-care.   Plan: Return again in 4 weeks.  Diagnosis: Recurrent major depressive disorder in partial remission  Collaboration of Care: Patient refused AEB none requested by the client.  Patient/Guardian was advised Release of Information must be obtained prior to any record release in order to collaborate their care with an outside provider. Patient/Guardian was advised if they have not already done so to contact the registration department to sign all necessary forms in order for [place] to release information regarding their care.   Consent: Patient/Guardian gives verbal consent for treatment and assignment of benefits for services provided during this visit. Patient/Guardian expressed understanding and agreed to proceed.   Neena Rhymes Tandi Hanko, LCSW 02/19/2023

## 2023-03-05 ENCOUNTER — Ambulatory Visit (INDEPENDENT_AMBULATORY_CARE_PROVIDER_SITE_OTHER): Payer: Medicaid Other | Admitting: Clinical

## 2023-03-05 DIAGNOSIS — F3341 Major depressive disorder, recurrent, in partial remission: Secondary | ICD-10-CM

## 2023-03-05 NOTE — Progress Notes (Unsigned)
THERAPIST PROGRESS NOTE  Session Time: 45 minutes  Participation Level: Active  Behavioral Response: CasualAlertEuthymic  Type of Therapy: Individual Therapy  Treatment Goals addressed: Client will practice problem-solving skills 3 times per week for the next 4 weeks  ProgressTowards Goals: Progressing  Interventions: CBT and Supportive  Summary:  Debra Herrera is a 42 y.o. female who presents for the scheduled appointment oriented x 5, appropriately dressed, and friendly.  Client denied hallucinations and delusions. Client reported on today she has been doing fairly well.  Client reported all of the kids in the house have been sick and out of school so things have been feeling a bit overwhelming.  Client reported recently she has started to do [address] and that is going pretty well.  Client reported now that she has insurance she is going to connect with a primary care physician to have some regular screenings and to discuss her difficulty with being able to stay asleep.  Client reported her depression and anxiety symptoms have been fairly manageable but she still has her days of feeling down related to thinking of her deceased husband.  Client reported her daughter is in therapy and one of the sessions was detailed about talking about her father.  Client reported they found it to be triggering her and she felt depressed for day.  Client reported it resonates with her because she to lost her father and so she knows how her daughter feels having those days in certain events that she wishes she could tell her dad.  Client reported she usually will push back her thoughts and feelings underneath an imaginary rug to not think about it.  Client reported talking about how she has been treated by her husband's family and the things that have gone on will not change anything.  Client reported she at this current time prefers to put her focus into her current day-to-day life and enjoying  the positive relationship and home environment that she has. Evidence of progress towards goal: Client reported 1 thinking pattern of avoidance regarding processing thoughts and feelings toward grief and her family dynamic.  Suicidal/Homicidal: Nowithout intent/plan  Therapist Response:  Therapist began the appointment asking the client how she has been doing. Therapist used cbt to engage using active listening and positive emotional support. Therapist used cbt to engage ask the client how she is coping with daily life and current severity of her symptoms. Therapist used CBT to ask the client about how she is coping with triggers from grief and how it affects changes in her family dynamic. Therapist used CBT to positively reinforce the client using skills of mindfulness to help her deal with emotions and behaviors related to grief. Therapist used CBT ask the client to identify her progress with frequency of use with coping skills with continued practice in her daily activity.    Therapist assigned client homework to practice self-care.   Plan: Return again in 4 weeks.  Diagnosis: Recurrent major depressive disorder, in partial remission  Collaboration of Care: Patient refused AEB none requested by the client.  Patient/Guardian was advised Release of Information must be obtained prior to any record release in order to collaborate their care with an outside provider. Patient/Guardian was advised if they have not already done so to contact the registration department to sign all necessary forms in order for [place] to release information regarding their care.   Consent: Patient/Guardian gives verbal consent for treatment and assignment of benefits for services provided during  this visit. Patient/Guardian expressed understanding and agreed to proceed.   Neena Rhymes Doylene Splinter, LCSW 03/05/2023

## 2023-03-19 ENCOUNTER — Ambulatory Visit (INDEPENDENT_AMBULATORY_CARE_PROVIDER_SITE_OTHER): Payer: Medicaid Other | Admitting: Clinical

## 2023-03-19 DIAGNOSIS — F3341 Major depressive disorder, recurrent, in partial remission: Secondary | ICD-10-CM

## 2023-03-19 NOTE — Progress Notes (Signed)
   THERAPIST PROGRESS NOTE  Session Time: 45 minutes  Participation Level: Active  Behavioral Response: CasualAlertAnxious  Type of Therapy: Individual Therapy  Treatment Goals addressed: Client will practice problem-solving skills 3 times per week for the next 4 weeks  ProgressTowards Goals: Not Progressing  Interventions: CBT and Supportive  Summary:  Debra Herrera is a 42 y.o. female who presents for the scheduled appointment oriented times five, appropriately dressed and friendly. Client denied hallucinations and delusions. Client reported on today she is doing fairly okay. Client reported she was recently triggered. Client reported she has been doing fairly okay.  Client reported she overslept and the kids missed school for the day.  Client reported her son spent the past weekend with his grandparents, his father's parent.  Client reported when he came back home he was wearing one of his dad's T-shirts.  Client reported it was very triggering to her and she did not understand why they brought him home and not sure.  Client reported she has negative emotions towards how to figure out but dynamic with them for the children's sake moving forward.  Client reported especially the grandfather/father-in-law has made it clear to reframe to her as a former family member.  Client reported with the holidays coming up she has been thinking about how she wants to spend it with the kids.  Client reported her brother-in-law and his wife have invited her and the kids to go spend a week with him of Christmas. Evidence of progress towards goal: Client reported 0 coping skill use for processing her grief as she notes that she tends to "sleep under the rug".   Suicidal/Homicidal: Nowithout intent/plan  Therapist Response:  Therapist began the appointment asking the client how she has been doing since last seen. Therapist used CBT to engage using active listening and positive emotional  support. Therapist used CBT to ask the client open-ended questions about events that have transpired that may have triggered some reactions of grief and/or anger. Therapist used CBT to normalize the clients emotional response and attempt to discuss some symptoms of grief. Therapist used CBT to have the client to identify how she keeps herself grounded in her daily activity. Therapist used CBT to positively reinforce positive social and family support. Therapist used CBT ask the client to identify her progress with frequency of use with coping skills with continued practice in her daily activity.      Plan: Return again in 4 weeks.  Diagnosis: Recurrent major depressive disorder, in partial remission  Collaboration of Care: Patient refused AEB none requested by the client.  Patient/Guardian was advised Release of Information must be obtained prior to any record release in order to collaborate their care with an outside provider. Patient/Guardian was advised if they have not already done so to contact the registration department to sign all necessary forms in order for [place] to release information regarding their care.   Consent: Patient/Guardian gives verbal consent for treatment and assignment of benefits for services provided during this visit. Patient/Guardian expressed understanding and agreed to proceed.   Neena Rhymes Kailash Hinze, LCSW 03/19/2023

## 2023-03-25 ENCOUNTER — Encounter (HOSPITAL_COMMUNITY): Payer: No Payment, Other | Admitting: Psychiatry

## 2023-03-27 ENCOUNTER — Telehealth (INDEPENDENT_AMBULATORY_CARE_PROVIDER_SITE_OTHER): Payer: Medicaid Other | Admitting: Psychiatry

## 2023-03-27 ENCOUNTER — Encounter (HOSPITAL_COMMUNITY): Payer: Self-pay | Admitting: Psychiatry

## 2023-03-27 DIAGNOSIS — G479 Sleep disorder, unspecified: Secondary | ICD-10-CM | POA: Diagnosis not present

## 2023-03-27 DIAGNOSIS — F411 Generalized anxiety disorder: Secondary | ICD-10-CM | POA: Diagnosis not present

## 2023-03-27 DIAGNOSIS — F3341 Major depressive disorder, recurrent, in partial remission: Secondary | ICD-10-CM | POA: Diagnosis not present

## 2023-03-27 DIAGNOSIS — F4321 Adjustment disorder with depressed mood: Secondary | ICD-10-CM

## 2023-03-27 MED ORDER — ESCITALOPRAM OXALATE 20 MG PO TABS: 20.0000 mg | ORAL_TABLET | Freq: Every day | ORAL | 3 refills | Status: DC

## 2023-03-27 MED ORDER — HYDROXYZINE HCL 10 MG PO TABS: 10.0000 mg | ORAL_TABLET | Freq: Three times a day (TID) | ORAL | 3 refills | Status: DC | PRN

## 2023-03-27 MED ORDER — MELATONIN 5 MG PO TABS: 5.0000 mg | ORAL_TABLET | Freq: Every evening | ORAL | 3 refills | Status: DC | PRN

## 2023-03-27 NOTE — Progress Notes (Signed)
BH MD/PA/NP OP Progress Note Virtual Visit via Video Note  I connected with Debra Herrera on 03/27/23 at  1:00 PM EDT by a video enabled telemedicine application and verified that I am speaking with the correct person using two identifiers.  Location: Patient: Home Provider: Clinic   I discussed the limitations of evaluation and management by telemedicine and the availability of in person appointments. The patient expressed understanding and agreed to proceed.  I provided 30 minutes of non-face-to-face time during this encounter.         03/27/2023 1:25 PM Debra Herrera  MRN:  [id number]  Chief Complaint:  "I have been thinking of Harrold Donath"    HPI: 42 year old female seen for follow up psychiatric evaluation. She has a psychiatric history of anxiety, OCD, and depression. She is currently prescribed Lexapro 20 mg daily and hydroxyzine 10 mg three times daily for anxiety.  Today she noted that medications regimen are somewhat effective in managing her psychiatric conditions.  Today she is well groomed, pleasant, cooperative, engaged in conversation, and maintaining eye contact. She informed Clinical research associate that she has been thinking more of her deceased husband. Patient notes that she has flashbacks of being told that he was dead. Patient notes that she is able to cope with this with the assistance of therapy. Patient notes that she is worried about things that she can not control. She notes that her sister in laws dogs is urinating and having bowel movements in the home.    Despite the above patient notes that her anxiety and depression is well managed. Today provider conducted a GAD 7 and patient scored a 12, at her last visit she scored a 7. Provider also conducted a PHQ 9 and patient scored a 6, at her last visit she scored a 7.  She endorses poor sleep and adequate appetite.  Today she denies SI/HI/VAH, mania, paranoia.  Today patient agreeable to starting Melatonin  5-10 mg nightly to help manage sleep.  Patient agreeable to continue all other medications as prescribed.  No other concerns at this time.     Visit Diagnosis:    ICD-10-CM   1. Grief  F43.21     2. Recurrent major depressive disorder, in partial remission (HCC)  F33.41 escitalopram (LEXAPRO) 20 MG tablet    3. Generalized anxiety disorder  F41.1 hydrOXYzine (ATARAX) 10 MG tablet    4. Sleep disturbance  G47.9 melatonin 5 MG TABS      Past Psychiatric History: Anxiety, OCD, and depression  Past Medical History:  Past Medical History:  Diagnosis Date   Anxiety    Depression    Headache(784.0)    Human papilloma virus    OCD (obsessive compulsive disorder)    TMJ arthralgia    Urinary tract infection     Past Surgical History:  Procedure Laterality Date   ADENOIDECTOMY     TONSILLECTOMY AND ADENOIDECTOMY      Family Psychiatric History: Brother Bipolar disorder, Mother OCD  Family History:  Family History  Problem Relation Age of Onset   Diabetes Father    Hypertension Father    Hypertension Mother    Other Neg Hx     Social History:  Social History   Socioeconomic History   Marital status: Married    Spouse name: Not on file   Number of children: Not on file   Years of education: Not on file   Highest education level: Not on file  Occupational History   Not on file  Tobacco Use   Smoking status: Never   Smokeless tobacco: Never  Vaping Use   Vaping status: Never Used  Substance and Sexual Activity   Alcohol use: No   Drug use: No   Sexual activity: Yes    Partners: Male  Other Topics Concern   Not on file  Social History Narrative   ** Merged History Encounter **       Social Determinants of Health   Financial Resource Strain: Not on file  Food Insecurity: Not on file  Transportation Needs: Not on file  Physical Activity: Not on file  Stress: Not on file  Social Connections: Not on file    Allergies:  Allergies  Allergen Reactions    Benadryl [Diphenhydramine Hcl] Rash   Latex Rash   Sulfa Antibiotics Rash    Metabolic Disorder Labs: No results found for: "HGBA1C", "MPG" No results found for: "PROLACTIN" No results found for: "CHOL", "TRIG", "HDL", "CHOLHDL", "VLDL", "LDLCALC" No results found for: "TSH"  Therapeutic Level Labs: No results found for: "LITHIUM" No results found for: "VALPROATE" No results found for: "CBMZ"  Current Medications: Current Outpatient Medications  Medication Sig Dispense Refill   melatonin 5 MG TABS Take 1-2 tablets (5-10 mg total) by mouth at bedtime as needed. 60 tablet 3   azithromycin (ZITHROMAX) 250 MG tablet Take 1 tablet (250 mg total) by mouth daily. Take first 2 tablets together, then 1 every day until finished. (Patient not taking: Reported on 10/29/2022) 6 tablet 0   benzonatate (TESSALON) 100 MG capsule Take 1 capsule (100 mg total) by mouth every 8 (eight) hours as needed for cough. (Patient not taking: Reported on 10/29/2022) 21 capsule 0   escitalopram (LEXAPRO) 20 MG tablet Take 1 tablet (20 mg total) by mouth daily. 30 tablet 3   fexofenadine (ALLEGRA ALLERGY) 180 MG tablet Take 1 tablet (180 mg total) by mouth daily for 15 days. (Patient not taking: Reported on 10/29/2022) 15 tablet 0   hydrOXYzine (ATARAX) 10 MG tablet Take 1 tablet (10 mg total) by mouth 3 (three) times daily as needed. 90 tablet 3   levonorgestrel (MIRENA) 20 MCG/24HR IUD 1 each by Intrauterine route once.     meloxicam (MOBIC) 15 MG tablet Take 1 tablet (15 mg total) by mouth daily. 30 tablet 0   methylPREDNISolone (MEDROL DOSEPAK) 4 MG TBPK tablet 6 day dose pack - take as directed (Patient not taking: Reported on 10/29/2022) 21 tablet 0   Multiple Vitamin (MULTIVITAMIN) tablet Take 1 tablet by mouth daily with breakfast.     TYLENOL 500 MG tablet Take 500-1,000 mg by mouth every 6 (six) hours as needed for mild pain (or headaches).     No current facility-administered medications for this visit.      Musculoskeletal: Strength & Muscle Tone: within normal limits Gait & Station: normal Patient leans: N/A  Psychiatric Specialty Exam: Review of Systems  There were no vitals taken for this visit.There is no height or weight on file to calculate BMI.  General Appearance: Well Groomed  Eye Contact:  Good  Speech:  Clear and Coherent and Normal Rate  Volume:  Normal  Mood:  Euthymic,  Affect:  Appropriate and Congruent  Thought Process:  Coherent, Goal Directed and Linear  Orientation:  Full (Time, Place, and Person)  Thought Content: WDL and Logical   Suicidal Thoughts:  No  Homicidal Thoughts:  No  Memory:  Immediate;   Good Recent;   Good Remote;   Good  Judgement:  Good  Insight:  Good  Psychomotor Activity:  Normal  Concentration:  Concentration: Good and Attention Span: Good  Recall:  Good  Fund of Knowledge: Good  Language: Good  Akathisia:  No  Handed:  Right  AIMS (if indicated): Not done  Assets:  Communication Skills Desire for Improvement Financial Resources/Insurance Housing Social Support  ADL's:  Intact  Cognition: WNL  Sleep:  Good   Screenings: GAD-7    Flowsheet Row Video Visit from 03/27/2023 in [facility] Clinical Support from 12/24/2022 in [facility] Clinical Support from 10/18/2022 in [facility] Clinical Support from 07/24/2022 in [facility] Clinical Support from 04/24/2022 in [facility]  Total GAD-7 Score 12 7 16 13 17       PHQ2-9    Flowsheet Row Video Visit from 03/27/2023 in [facility] Clinical Support from 12/24/2022 in [facility] Clinical Support from 10/18/2022 in [facility] Clinical Support from 07/24/2022 in [facility] Clinical Support from 04/24/2022 in [facility]  PHQ-2 Total Score 2 0 3 2 6   PHQ-9 Total Score 6 7 11 8 23       Flowsheet Row ED from 01/15/2023 in [facility] Department at [facility] ED from 10/29/2022 in [facility] Emergency Department at [facility] Clinical Support from 10/18/2022 in [facility]  C-SSRS RISK CATEGORY No Risk No Risk Error: Q7 should not be populated when Q6 is No        Assessment and Plan: Patient reports that been stressed out and thinking more about her deceased husband.  She informed Clinical research associate that she is able to cope with it.  She also informed writer that her sleep has been poor.  Today patient agreeable to starting Melatonin 5-10 mg nightly to help manage sleep.  Patient agreeable to continue all other medications as prescribed.  1. Recurrent major depressive disorder, in partial remission (HCC)  Continue- escitalopram (LEXAPRO) 20 MG tablet; Take 1 tablet (20 mg total) by mouth daily.  Dispense: 30 tablet; Refill: 3  2. Generalized anxiety disorder  Continue- hydrOXYzine (ATARAX) 10 MG tablet; Take 1 tablet (10 mg total) by mouth 3 (three) times daily as needed.  Dispense: 90 tablet; Refill: 3  3. Grief   4. Sleep disturbance  Start- melatonin 5 MG TABS; Take 1-2 tablets (5-10 mg total) by mouth at bedtime as needed.  Dispense: 60 tablet; Refill: 3   Follow up in 3 months.    Shanna Cisco, NP 03/27/2023, 1:25 PM

## 2023-04-08 ENCOUNTER — Ambulatory Visit (HOSPITAL_COMMUNITY): Payer: No Payment, Other | Admitting: Clinical

## 2023-04-22 ENCOUNTER — Ambulatory Visit (HOSPITAL_COMMUNITY): Payer: No Payment, Other | Admitting: Clinical

## 2023-05-08 ENCOUNTER — Emergency Department (HOSPITAL_BASED_OUTPATIENT_CLINIC_OR_DEPARTMENT_OTHER): Payer: Medicaid Other

## 2023-05-08 ENCOUNTER — Encounter (HOSPITAL_BASED_OUTPATIENT_CLINIC_OR_DEPARTMENT_OTHER): Payer: Self-pay

## 2023-05-08 ENCOUNTER — Emergency Department (HOSPITAL_BASED_OUTPATIENT_CLINIC_OR_DEPARTMENT_OTHER)
Admission: EM | Admit: 2023-05-08 | Discharge: 2023-05-09 | Disposition: A | Discharge status: Home / Self Care | Payer: Medicaid Other | Attending: Emergency Medicine | Admitting: Emergency Medicine

## 2023-05-08 ENCOUNTER — Other Ambulatory Visit: Payer: Self-pay

## 2023-05-08 DIAGNOSIS — M545 Low back pain, unspecified: Secondary | ICD-10-CM | POA: Diagnosis present

## 2023-05-08 DIAGNOSIS — Z91040 Latex allergy status: Secondary | ICD-10-CM | POA: Insufficient documentation

## 2023-05-08 LAB — URINALYSIS, ROUTINE W REFLEX MICROSCOPIC
Bilirubin Urine: NEGATIVE
Glucose, UA: NEGATIVE mg/dL
Ketones, ur: NEGATIVE mg/dL
Leukocytes,Ua: NEGATIVE
Nitrite: NEGATIVE
Specific Gravity, Urine: 1.027 (ref 1.005–1.030)
pH: 5.5 (ref 5.0–8.0)

## 2023-05-08 LAB — BASIC METABOLIC PANEL
Anion gap: 7 (ref 5–15)
BUN: 18 mg/dL (ref 6–20)
CO2: 30 mmol/L (ref 22–32)
Calcium: 9.3 mg/dL (ref 8.9–10.3)
Chloride: 100 mmol/L (ref 98–111)
Creatinine, Ser: 0.8 mg/dL (ref 0.44–1.00)
GFR, Estimated: >60 mL/min (ref >60)
Glucose, Bld: 104 mg/dL — ABNORMAL HIGH (ref 70–99)
Potassium: 4.2 mmol/L (ref 3.5–5.1)
Sodium: 137 mmol/L (ref 135–145)

## 2023-05-08 LAB — CBC
HCT: 38.1 % (ref 36.0–46.0)
Hemoglobin: 12.5 g/dL (ref 12.0–15.0)
MCH: 29.6 pg (ref 26.0–34.0)
MCHC: 32.8 g/dL (ref 30.0–36.0)
MCV: 90.1 fL (ref 80.0–100.0)
Platelets: 326 10*3/uL (ref 150–400)
RBC: 4.23 MIL/uL (ref 3.87–5.11)
RDW: 13.5 % (ref 11.5–15.5)
WBC: 7.1 10*3/uL (ref 4.0–10.5)
nRBC: 0 % (ref 0.0–0.2)

## 2023-05-08 LAB — PREGNANCY, URINE: Preg Test, Ur: NEGATIVE

## 2023-05-08 MED ORDER — KETOROLAC TROMETHAMINE 60 MG/2ML IM SOLN
60.0000 mg | Freq: Once | INTRAMUSCULAR | Status: AC
  Administered 2023-05-08: 60 mg via INTRAMUSCULAR
  Filled 2023-05-08: qty 2

## 2023-05-08 MED ORDER — ONDANSETRON 4 MG PO TBDP
4.0000 mg | ORAL_TABLET | Freq: Once | ORAL | Status: AC | PRN
  Administered 2023-05-08: 4 mg via ORAL
  Filled 2023-05-08: qty 1

## 2023-05-08 NOTE — ED Provider Notes (Signed)
[place] EMERGENCY DEPARTMENT AT [facility] Provider Note   CSN: [id number] Arrival date & time: 05/08/23  2027     History {Add pertinent medical, surgical, social history, OB history to HPI:1} Chief Complaint  Patient presents with   Back Pain    Rosland Pilcher is a 42 y.o. female.  The history is provided by the patient.  Back Pain Kaydense Scialdone is a 42 y.o. female who presents to the Emergency Department complaining of *** Sudden left low back pain while eating dinner. 8p.  Had vomiting. Goes up her back some No abdominal pain. No fever.    Has occasional dysuria.  No injuries.   Hx/o HPL, preDM     Home Medications Prior to Admission medications   Medication Sig Start Date End Date Taking? Authorizing Provider  azithromycin (ZITHROMAX) 250 MG tablet Take 1 tablet (250 mg total) by mouth daily. Take first 2 tablets together, then 1 every day until finished. Patient not taking: Reported on 10/29/2022 03/19/22   Trevor Iha, FNP  benzonatate (TESSALON) 100 MG capsule Take 1 capsule (100 mg total) by mouth every 8 (eight) hours as needed for cough. Patient not taking: Reported on 10/29/2022 03/01/22   Gustavus Bryant, FNP  escitalopram (LEXAPRO) 20 MG tablet Take 1 tablet (20 mg total) by mouth daily. 03/27/23   Shanna Cisco, NP  fexofenadine (ALLEGRA ALLERGY) 180 MG tablet Take 1 tablet (180 mg total) by mouth daily for 15 days. Patient not taking: Reported on 10/29/2022 03/19/22 10/29/22  Trevor Iha, FNP  hydrOXYzine (ATARAX) 10 MG tablet Take 1 tablet (10 mg total) by mouth 3 (three) times daily as needed. 03/27/23   Shanna Cisco, NP  levonorgestrel (MIRENA) 20 MCG/24HR IUD 1 each by Intrauterine route once.    [provider]  melatonin 5 MG TABS Take 1-2 tablets (5-10 mg total) by mouth at bedtime as needed. 03/27/23   Shanna Cisco, NP  meloxicam (MOBIC) 15 MG tablet Take 1 tablet (15 mg total) by mouth  daily. 01/15/23   Horton, Mayer Masker, MD  methylPREDNISolone (MEDROL DOSEPAK) 4 MG TBPK tablet 6 day dose pack - take as directed Patient not taking: Reported on 10/29/2022 08/16/20   Ernestene Kiel T, DPM  Multiple Vitamin (MULTIVITAMIN) tablet Take 1 tablet by mouth daily with breakfast.    [provider]  TYLENOL 500 MG tablet Take 500-1,000 mg by mouth every 6 (six) hours as needed for mild pain (or headaches).    [provider]      Allergies    Benadryl [diphenhydramine hcl], Latex, and Sulfa antibiotics    Review of Systems   Review of Systems  Musculoskeletal:  Positive for back pain.  All other systems reviewed and are negative.   Physical Exam Updated Vital Signs BP 123/81   Pulse 77   Temp 97.9 F (36.6 C)   Resp 17   Ht 5\' 4"  (1.626 m)   Wt 96.6 kg   LMP 04/28/2023   SpO2 97%   BMI 36.56 kg/m  Physical Exam Vitals and nursing note reviewed.  Constitutional:      Appearance: She is well-developed.  HENT:     Head: Normocephalic and atraumatic.  Cardiovascular:     Rate and Rhythm: Normal rate and regular rhythm.  Pulmonary:     Effort: Pulmonary effort is normal. No respiratory distress.     Breath sounds: Normal breath sounds.  Abdominal:     Palpations: Abdomen is  soft.     Tenderness: There is no right CVA tenderness, left CVA tenderness, guarding or rebound.     Comments: Mild left lower quadrant tenderness  Musculoskeletal:        General: No swelling or tenderness.  Skin:    General: Skin is warm and dry.  Neurological:     Mental Status: She is alert and oriented to person, place, and time.     Comments: 5/5 strength in BLE with sensation to light touch intact in BLE  Psychiatric:        Behavior: Behavior normal.     ED Results / Procedures / Treatments   Labs (all labs ordered are listed, but only abnormal results are displayed) Labs Reviewed  URINALYSIS, ROUTINE W REFLEX MICROSCOPIC - Abnormal; Notable for the following  components:      Result Value   Hgb urine dipstick SMALL (*)    Protein, ur TRACE (*)    Bacteria, UA RARE (*)    All other components within normal limits  BASIC METABOLIC PANEL - Abnormal; Notable for the following components:   Glucose, Bld 104 (*)    All other components within normal limits  PREGNANCY, URINE  CBC    EKG None  Radiology No results found.  Procedures Procedures  {Document cardiac monitor, telemetry assessment procedure when appropriate:1}  Medications Ordered in ED Medications  ondansetron (ZOFRAN-ODT) disintegrating tablet 4 mg (4 mg Oral Given 05/08/23 2056)    ED Course/ Medical Decision Making/ A&P   {   Click here for ABCD2, HEART and other calculatorsREFRESH Note before signing :1}                              Medical Decision Making Amount and/or Complexity of Data Reviewed Labs: ordered.  Risk Prescription drug management.   ***  {Document critical care time when appropriate:1} {Document review of labs and clinical decision tools ie heart score, Chads2Vasc2 etc:1}  {Document your independent review of radiology images, and any outside records:1} {Document your discussion with family members, caretakers, and with consultants:1} {Document social determinants of health affecting pt's care:1} {Document your decision making why or why not admission, treatments were needed:1} Final Clinical Impression(s) / ED Diagnoses Final diagnoses:  None    Rx / DC Orders ED Discharge Orders     None

## 2023-05-08 NOTE — ED Triage Notes (Signed)
Lower back pain onset today while sitting at dinner with family. Took tylenol, felt nausea, vomited. Denies urinary symptoms.

## 2023-05-08 NOTE — ED Notes (Signed)
Patient transported to CT 

## 2023-05-09 MED ORDER — ONDANSETRON HCL 4 MG PO TABS: 4.0000 mg | ORAL_TABLET | Freq: Three times a day (TID) | ORAL | 0 refills | Status: DC | PRN

## 2023-05-09 MED ORDER — METHOCARBAMOL 500 MG PO TABS: 500.0000 mg | ORAL_TABLET | Freq: Three times a day (TID) | ORAL | 0 refills | Status: DC | PRN

## 2023-06-04 ENCOUNTER — Telehealth (HOSPITAL_COMMUNITY): Payer: No Payment, Other | Admitting: Psychiatry

## 2023-06-07 ENCOUNTER — Encounter (HOSPITAL_COMMUNITY): Payer: Self-pay | Admitting: Psychiatry

## 2023-06-07 ENCOUNTER — Telehealth (HOSPITAL_COMMUNITY): Payer: Medicaid Other | Admitting: Psychiatry

## 2023-06-07 DIAGNOSIS — F411 Generalized anxiety disorder: Secondary | ICD-10-CM | POA: Diagnosis not present

## 2023-06-07 DIAGNOSIS — F3341 Major depressive disorder, recurrent, in partial remission: Secondary | ICD-10-CM | POA: Diagnosis not present

## 2023-06-07 DIAGNOSIS — G479 Sleep disorder, unspecified: Secondary | ICD-10-CM

## 2023-06-07 MED ORDER — ESCITALOPRAM OXALATE 20 MG PO TABS: 20.0000 mg | ORAL_TABLET | Freq: Every day | ORAL | 3 refills | Status: DC

## 2023-06-07 MED ORDER — HYDROXYZINE HCL 10 MG PO TABS: 10.0000 mg | ORAL_TABLET | Freq: Three times a day (TID) | ORAL | 3 refills | Status: DC | PRN

## 2023-06-07 MED ORDER — MELATONIN 5 MG PO TABS: 5.0000 mg | ORAL_TABLET | Freq: Every evening | ORAL | 3 refills | Status: DC | PRN

## 2023-06-07 NOTE — Progress Notes (Signed)
BH MD/PA/NP OP Progress Note Virtual Visit via Video Note  I connected with Debra Herrera on 06/07/23 at 10:30 AM EST by a video enabled telemedicine application and verified that I am speaking with the correct person using two identifiers.  Location: Patient: Home Provider: Clinic   I discussed the limitations of evaluation and management by telemedicine and the availability of in person appointments. The patient expressed understanding and agreed to proceed.  I provided 30 minutes of non-face-to-face time during this encounter.         06/07/2023 10:52 AM Debra Herrera  MRN:  [id number]  Chief Complaint:  "I have been doing okay"    HPI: 42 year old female seen for follow up psychiatric evaluation. She has a psychiatric history of anxiety, OCD, and depression. She is currently prescribed Lexapro 20 mg daily, melatonin 5-10 mg as needed, and hydroxyzine 10 mg three times daily for anxiety.  Today she noted that medications regimen are effective in managing her psychiatric conditions.  Today she is well groomed, pleasant, cooperative, engaged in conversation, and maintaining eye contact. She informed Clinical research associate that she has been doing okay. She reports that at times she has issues maintianing sleep because she is a light sleeper and reports that simple noises wakes her up.  She however notes that she is able to cope with this.  Since her last visit she informed writer that her anxiety and depression continues to be well-managed.  Provider conducted a GAD-7 and patient scored 10, at her last visit she scored a 12.  Provider also conducted PHQ-9 and patient scored a 10, at her last visit she scored a 6.  She endorses adequate appetite.  Today she denies SI/HI/VAH, mania, paranoia.    Patient informed writer that she missed her last therapy appointment.  Provider rescheduled patient's appointment.  No medication changes made today.  Patient agreeable to continue  medications as prescribed.  No other concerns at this time.    Visit Diagnosis:    ICD-10-CM   1. Recurrent major depressive disorder, in partial remission (HCC)  F33.41 escitalopram (LEXAPRO) 20 MG tablet    2. Generalized anxiety disorder  F41.1 hydrOXYzine (ATARAX) 10 MG tablet    3. Sleep disturbance  G47.9 melatonin 5 MG TABS       Past Psychiatric History: Anxiety, OCD, and depression  Past Medical History:  Past Medical History:  Diagnosis Date   Anxiety    Depression    Headache(784.0)    Human papilloma virus    OCD (obsessive compulsive disorder)    TMJ arthralgia    Urinary tract infection     Past Surgical History:  Procedure Laterality Date   ADENOIDECTOMY     TONSILLECTOMY AND ADENOIDECTOMY      Family Psychiatric History: Brother Bipolar disorder, Mother OCD  Family History:  Family History  Problem Relation Age of Onset   Diabetes Father    Hypertension Father    Hypertension Mother    Other Neg Hx     Social History:  Social History   Socioeconomic History   Marital status: Widowed    Spouse name: Not on file   Number of children: Not on file   Years of education: Not on file   Highest education level: Not on file  Occupational History   Not on file  Tobacco Use   Smoking status: Never   Smokeless tobacco: Never  Vaping Use   Vaping status: Never Used  Substance and Sexual Activity  Alcohol use: No   Drug use: No   Sexual activity: Yes    Partners: Male  Other Topics Concern   Not on file  Social History Narrative   ** Merged History Encounter **       Social Drivers of Corporate investment banker Strain: Not on file  Food Insecurity: Not on file  Transportation Needs: Not on file  Physical Activity: Not on file  Stress: Not on file  Social Connections: Not on file    Allergies:  Allergies  Allergen Reactions   Benadryl [Diphenhydramine Hcl] Rash   Latex Rash   Sulfa Antibiotics Rash    Metabolic Disorder  Labs: No results found for: "HGBA1C", "MPG" No results found for: "PROLACTIN" No results found for: "CHOL", "TRIG", "HDL", "CHOLHDL", "VLDL", "LDLCALC" No results found for: "TSH"  Therapeutic Level Labs: No results found for: "LITHIUM" No results found for: "VALPROATE" No results found for: "CBMZ"  Current Medications: Current Outpatient Medications  Medication Sig Dispense Refill   azithromycin (ZITHROMAX) 250 MG tablet Take 1 tablet (250 mg total) by mouth daily. Take first 2 tablets together, then 1 every day until finished. (Patient not taking: Reported on 10/29/2022) 6 tablet 0   benzonatate (TESSALON) 100 MG capsule Take 1 capsule (100 mg total) by mouth every 8 (eight) hours as needed for cough. (Patient not taking: Reported on 10/29/2022) 21 capsule 0   escitalopram (LEXAPRO) 20 MG tablet Take 1 tablet (20 mg total) by mouth daily. 30 tablet 3   fexofenadine (ALLEGRA ALLERGY) 180 MG tablet Take 1 tablet (180 mg total) by mouth daily for 15 days. (Patient not taking: Reported on 10/29/2022) 15 tablet 0   hydrOXYzine (ATARAX) 10 MG tablet Take 1 tablet (10 mg total) by mouth 3 (three) times daily as needed. 90 tablet 3   levonorgestrel (MIRENA) 20 MCG/24HR IUD 1 each by Intrauterine route once.     melatonin 5 MG TABS Take 1-2 tablets (5-10 mg total) by mouth at bedtime as needed. 60 tablet 3   meloxicam (MOBIC) 15 MG tablet Take 1 tablet (15 mg total) by mouth daily. 30 tablet 0   methocarbamol (ROBAXIN) 500 MG tablet Take 1 tablet (500 mg total) by mouth every 8 (eight) hours as needed for muscle spasms. 15 tablet 0   methylPREDNISolone (MEDROL DOSEPAK) 4 MG TBPK tablet 6 day dose pack - take as directed (Patient not taking: Reported on 10/29/2022) 21 tablet 0   Multiple Vitamin (MULTIVITAMIN) tablet Take 1 tablet by mouth daily with breakfast.     ondansetron (ZOFRAN) 4 MG tablet Take 1 tablet (4 mg total) by mouth every 8 (eight) hours as needed for nausea or vomiting. 12 tablet 0    TYLENOL 500 MG tablet Take 500-1,000 mg by mouth every 6 (six) hours as needed for mild pain (or headaches).     No current facility-administered medications for this visit.     Musculoskeletal: Strength & Muscle Tone: within normal limits and telehealth visit Gait & Station: normal, telehealth visit Patient leans: N/A  Psychiatric Specialty Exam: Review of Systems  Last menstrual period 04/28/2023.There is no height or weight on file to calculate BMI.  General Appearance: Well Groomed  Eye Contact:  Good  Speech:  Clear and Coherent and Normal Rate  Volume:  Normal  Mood:  Euthymic,  Affect:  Appropriate and Congruent  Thought Process:  Coherent, Goal Directed and Linear  Orientation:  Full (Time, Place, and Person)  Thought Content: WDL and Logical  Suicidal Thoughts:  No  Homicidal Thoughts:  No  Memory:  Immediate;   Good Recent;   Good Remote;   Good  Judgement:  Good  Insight:  Good  Psychomotor Activity:  Normal  Concentration:  Concentration: Good and Attention Span: Good  Recall:  Good  Fund of Knowledge: Good  Language: Good  Akathisia:  No  Handed:  Right  AIMS (if indicated): Not done  Assets:  Communication Skills Desire for Improvement Financial Resources/Insurance Housing Social Support  ADL's:  Intact  Cognition: WNL  Sleep:  Fair   Screenings: GAD-7    Flowsheet Row Video Visit from 06/07/2023 in [facility] Video Visit from 03/27/2023 in [facility] Clinical Support from 12/24/2022 in [facility] Clinical Support from 10/18/2022 in [facility] Clinical Support from 07/24/2022 in [facility]  Total GAD-7 Score 10 12 7 16 13       PHQ2-9    Flowsheet Row Video Visit from 06/07/2023 in [facility] Video Visit from 03/27/2023 in [facility] Clinical  Support from 12/24/2022 in [facility] Clinical Support from 10/18/2022 in [facility] Clinical Support from 07/24/2022 in [facility]  PHQ-2 Total Score 1 2 0 3 2  PHQ-9 Total Score 10 6 7 11 8       Flowsheet Row ED from 05/08/2023 in [facility] Emergency Department at [facility] ED from 01/15/2023 in [facility] Emergency Department at [facility] ED from 10/29/2022 in [facility] Emergency Department at [facility]  C-SSRS RISK CATEGORY No Risk No Risk No Risk        Assessment and Plan: Patient reports at times she has issues maintaining sleep but reports that she is able to cope with this.  She also informed Clinical research associate that she missed her last therapy appointment.  Provider rescheduled patient's appointment.  No medication changes made today.  Patient agreeable to continue medications as prescribed.  1. Recurrent major depressive disorder, in partial remission (HCC)  Continue- escitalopram (LEXAPRO) 20 MG tablet; Take 1 tablet (20 mg total) by mouth daily.  Dispense: 30 tablet; Refill: 3  2. Generalized anxiety disorder  Continue- - hydrOXYzine (ATARAX) 10 MG tablet; Take 1 tablet (10 mg total) by mouth 3 (three) times daily as needed.  Dispense: 90 tablet; Refill: 3  3. Sleep disturbance  Continue- - melatonin 5 MG TABS; Take 1-2 tablets (5-10 mg total) by mouth at bedtime as needed.  Dispense: 60 tablet; Refill: 3     Follow up in 3 months.    Shanna Cisco, NP 06/07/2023, 10:52 AM

## 2023-06-10 ENCOUNTER — Ambulatory Visit (HOSPITAL_COMMUNITY): Payer: No Payment, Other | Admitting: Clinical

## 2023-07-24 ENCOUNTER — Ambulatory Visit: Payer: Medicaid Other | Admitting: Obstetrics & Gynecology

## 2023-07-24 ENCOUNTER — Other Ambulatory Visit (HOSPITAL_COMMUNITY)
Admission: RE | Admit: 2023-07-24 | Discharge: 2023-07-24 | Disposition: A | Discharge status: Home / Self Care | Payer: Medicaid Other | Source: Ambulatory Visit | Attending: Obstetrics & Gynecology | Admitting: Obstetrics & Gynecology

## 2023-07-24 ENCOUNTER — Encounter: Payer: Self-pay | Admitting: Obstetrics & Gynecology

## 2023-07-24 VITALS — BP 113/73 | HR 77 | Ht 64.0 in | Wt 213.0 lb

## 2023-07-24 DIAGNOSIS — Z01419 Encounter for gynecological examination (general) (routine) without abnormal findings: Secondary | ICD-10-CM

## 2023-07-24 DIAGNOSIS — N938 Other specified abnormal uterine and vaginal bleeding: Secondary | ICD-10-CM

## 2023-07-24 NOTE — Progress Notes (Signed)
 Cycle 1/7- normal cycle then bleeding again 1/27- clotting, small to large with lots of cramping x 1 week.  This has not happened in the past.

## 2023-07-24 NOTE — Progress Notes (Signed)
 GYNECOLOGY CLINIC ANNUAL PREVENTATIVE CARE ENCOUNTER NOTE  Subjective:   Debra Herrera is a 43 y.o. [phone] female here for a routine annual gynecologic exam.  Current complaints: She had menses 1/7 then resumed bleeding with clots on 1/27. Her menses were regular.     Gynecologic History Patient's last menstrual period was 06/25/2023. Contraception: vasectomy Last Pap: 2018. Results were: normal   Obstetric History OB History  Gravida Para Term Preterm AB Living  2 2 2  0 0 2  SAB IAB Ectopic Multiple Live Births  0 0 0  2    # Outcome Date GA Lbr Len/2nd Weight Sex Type Anes PTL Lv  2 Term 11/09/12 [redacted]w[redacted]d 13:28 / 00:51  M Vag-Spont EPI  LIV  1 Term      Vag-Spont   LIV    Past Medical History:  Diagnosis Date   Anxiety    Depression    Headache(784.0)    Human papilloma virus    Hypercholesteremia    OCD (obsessive compulsive disorder)    TMJ arthralgia    Urinary tract infection     Past Surgical History:  Procedure Laterality Date   ADENOIDECTOMY     TONSILLECTOMY AND ADENOIDECTOMY      Current Outpatient Medications on File Prior to Visit  Medication Sig Dispense Refill   atorvastatin (LIPITOR) 80 MG tablet Take 80 mg by mouth daily.     escitalopram  (LEXAPRO ) 20 MG tablet Take 1 tablet (20 mg total) by mouth daily. 30 tablet 3   hydrOXYzine  (ATARAX ) 10 MG tablet Take 1 tablet (10 mg total) by mouth 3 (three) times daily as needed. 90 tablet 3   melatonin 5 MG TABS Take 1-2 tablets (5-10 mg total) by mouth at bedtime as needed. 60 tablet 3   meloxicam  (MOBIC ) 15 MG tablet Take 1 tablet (15 mg total) by mouth daily. 30 tablet 0   methocarbamol  (ROBAXIN ) 500 MG tablet Take 1 tablet (500 mg total) by mouth every 8 (eight) hours as needed for muscle spasms. 15 tablet 0   methylPREDNISolone  (MEDROL  DOSEPAK) 4 MG TBPK tablet 6 day dose pack - take as directed 21 tablet 0   Multiple Vitamin (MULTIVITAMIN) tablet Take 1 tablet by mouth daily with breakfast.      TYLENOL  500 MG tablet Take 500-1,000 mg by mouth every 6 (six) hours as needed for mild pain (or headaches).     azithromycin  (ZITHROMAX ) 250 MG tablet Take 1 tablet (250 mg total) by mouth daily. Take first 2 tablets together, then 1 every day until finished. (Patient not taking: Reported on 10/29/2022) 6 tablet 0   benzonatate  (TESSALON ) 100 MG capsule Take 1 capsule (100 mg total) by mouth every 8 (eight) hours as needed for cough. (Patient not taking: Reported on 10/29/2022) 21 capsule 0   fexofenadine  (ALLEGRA  ALLERGY) 180 MG tablet Take 1 tablet (180 mg total) by mouth daily for 15 days. (Patient not taking: Reported on 10/29/2022) 15 tablet 0   ondansetron  (ZOFRAN ) 4 MG tablet Take 1 tablet (4 mg total) by mouth every 8 (eight) hours as needed for nausea or vomiting. (Patient not taking: Reported on 07/24/2023) 12 tablet 0   No current facility-administered medications on file prior to visit.    Allergies  Allergen Reactions   Benadryl  [Diphenhydramine  Hcl] Rash   Latex Rash   Sulfa Antibiotics Rash    Social History   Socioeconomic History   Marital status: Widowed    Spouse name: Not on file  Number of children: Not on file   Years of education: Not on file   Highest education level: Not on file  Occupational History   Not on file  Tobacco Use   Smoking status: Never   Smokeless tobacco: Never  Vaping Use   Vaping status: Never Used  Substance and Sexual Activity   Alcohol use: No   Drug use: No   Sexual activity: Yes    Partners: Male  Other Topics Concern   Not on file  Social History Narrative   ** Merged History Encounter **       Social Drivers of Corporate Investment Banker Strain: Not on file  Food Insecurity: Not on file  Transportation Needs: Not on file  Physical Activity: Not on file  Stress: Not on file  Social Connections: Not on file  Intimate Partner Violence: Not on file    Family History  Problem Relation Age of Onset   Diabetes Father     Hypertension Father    Hypertension Mother    Other Neg Hx     The following portions of the patient's history were reviewed and updated as appropriate: allergies, current medications, past family history, past medical history, past social history, past surgical history and problem list.  Review of Systems Constitutional: negative Respiratory: negative Cardiovascular: negative Gastrointestinal: positive for negative Genitourinary:positive for abnormal menstrual periods   Objective:  BP 113/73   Pulse 77   Ht 5' 4 (1.626 m)   Wt 213 lb (96.6 kg)   LMP 06/25/2023   BMI 36.56 kg/m  CONSTITUTIONAL: Well-developed, well-nourished female in no acute distress. Obese HENT:  Normocephalic, atraumatic, External right and left ear normal. Oropharynx is clear and moist EYES: Conjunctivae and EOM are normal. Pupils are equal, round, and reactive to light. No scleral icterus.  NECK: Normal range of motion, supple, no masses.  Normal thyroid.  SKIN: Skin is warm and dry. No rash noted. Not diaphoretic. No erythema. No pallor. NEUROLGIC: Alert and oriented to person, place, and time. Normal reflexes, muscle tone coordination. No cranial nerve deficit noted. PSYCHIATRIC: Normal mood and affect. Normal behavior. Normal judgment and thought content. CARDIOVASCULAR: Normal heart rate noted, regular rhythm RESPIRATORY: Clear to auscultation bilaterally. Effort and breath sounds normal, no problems with respiration noted. BREASTS: Symmetric in size. No masses, skin changes, nipple drainage, or lymphadenopathy. ABDOMEN: Soft, normal bowel sounds, no distention noted.  No tenderness, rebound or guarding.  PELVIC: Normal appearing external genitalia; normal appearing vaginal mucosa and cervix.  No abnormal discharge noted.  Pap smear obtained.  Normal uterine size, no other palpable masses, no uterine or adnexal tenderness. MUSCULOSKELETAL: Normal range of motion. No tenderness.  No cyanosis, clubbing,  or edema.    Assessment:  Annual gynecologic examination with pap smear  Well woman exam with routine gynecological exam - Plan: Cytology - PAP( [facility]), MM 3D SCREENING MAMMOGRAM BILATERAL BREAST, CANCELED: MM Digital Screening  DUB (dysfunctional uterine bleeding) - Plan: US  PELVIC COMPLETE WITH TRANSVAGINAL  Plan:  Will follow up results of pap smear and manage accordingly. Pelvic US  scheduled and 3 mo f/u Mammogram scheduled Routine preventative health maintenance measures emphasized. Please refer to After Visit Summary for other counseling recommendations.    LYNWOOD SOLOMONS, MD Attending Obstetrician & Gynecologist Center for [employer], [facility]

## 2023-07-29 LAB — CYTOLOGY - PAP
Adequacy: ABSENT
Chlamydia: NEGATIVE
Comment: NEGATIVE
Comment: NEGATIVE
Comment: NEGATIVE
Comment: NORMAL
Diagnosis: NEGATIVE
High risk HPV: NEGATIVE
Neisseria Gonorrhea: NEGATIVE
Trichomonas: NEGATIVE

## 2023-08-02 ENCOUNTER — Encounter: Payer: Self-pay | Admitting: Obstetrics & Gynecology

## 2023-08-09 ENCOUNTER — Ambulatory Visit (HOSPITAL_BASED_OUTPATIENT_CLINIC_OR_DEPARTMENT_OTHER)
Admission: RE | Admit: 2023-08-09 | Discharge: 2023-08-09 | Disposition: A | Discharge status: Home / Self Care | Payer: Medicaid Other | Source: Ambulatory Visit | Attending: Obstetrics & Gynecology | Admitting: Obstetrics & Gynecology

## 2023-08-09 ENCOUNTER — Ambulatory Visit: Payer: Medicaid Other

## 2023-08-09 ENCOUNTER — Encounter (HOSPITAL_BASED_OUTPATIENT_CLINIC_OR_DEPARTMENT_OTHER): Payer: Self-pay | Admitting: Radiology

## 2023-08-09 DIAGNOSIS — Z1231 Encounter for screening mammogram for malignant neoplasm of breast: Secondary | ICD-10-CM | POA: Insufficient documentation

## 2023-08-09 DIAGNOSIS — Z01419 Encounter for gynecological examination (general) (routine) without abnormal findings: Secondary | ICD-10-CM

## 2023-08-09 DIAGNOSIS — N938 Other specified abnormal uterine and vaginal bleeding: Secondary | ICD-10-CM | POA: Insufficient documentation

## 2023-08-14 ENCOUNTER — Encounter: Payer: Self-pay | Admitting: Obstetrics & Gynecology

## 2023-08-20 ENCOUNTER — Telehealth (HOSPITAL_COMMUNITY): Payer: No Payment, Other | Admitting: Psychiatry

## 2023-08-20 ENCOUNTER — Encounter (HOSPITAL_COMMUNITY): Payer: Self-pay

## 2023-08-22 ENCOUNTER — Other Ambulatory Visit: Payer: Self-pay

## 2023-08-22 DIAGNOSIS — R109 Unspecified abdominal pain: Secondary | ICD-10-CM | POA: Diagnosis present

## 2023-08-22 DIAGNOSIS — R300 Dysuria: Secondary | ICD-10-CM | POA: Insufficient documentation

## 2023-08-22 DIAGNOSIS — Z5321 Procedure and treatment not carried out due to patient leaving prior to being seen by health care provider: Secondary | ICD-10-CM | POA: Insufficient documentation

## 2023-08-22 MED ORDER — ONDANSETRON 4 MG PO TBDP
4.0000 mg | ORAL_TABLET | Freq: Once | ORAL | Status: AC
  Administered 2023-08-22: 4 mg via ORAL
  Filled 2023-08-22: qty 1

## 2023-08-22 NOTE — ED Triage Notes (Addendum)
 Left flank pain into groin. Dysuria. Xray at [facility] shows kidney stone-given tramadol, zofran, oxycodone, and flomax. Coming in to ED for ongoing pain.  Consulted PA on triage workup due to UC testing- no additional testing at this time.

## 2023-08-23 ENCOUNTER — Emergency Department (HOSPITAL_BASED_OUTPATIENT_CLINIC_OR_DEPARTMENT_OTHER)
Admission: EM | Admit: 2023-08-23 | Discharge: 2023-08-23 | Discharge status: Against Medical Advice | Attending: Emergency Medicine | Admitting: Emergency Medicine

## 2023-08-23 LAB — COMPREHENSIVE METABOLIC PANEL
ALT: 24 U/L (ref 0–44)
AST: 20 U/L (ref 15–41)
Albumin: 5.2 g/dL — ABNORMAL HIGH (ref 3.5–5.0)
Alkaline Phosphatase: 63 U/L (ref 38–126)
Anion gap: 10 (ref 5–15)
BUN: 19 mg/dL (ref 6–20)
CO2: 27 mmol/L (ref 22–32)
Calcium: 10.5 mg/dL — ABNORMAL HIGH (ref 8.9–10.3)
Chloride: 101 mmol/L (ref 98–111)
Creatinine, Ser: 1.19 mg/dL — ABNORMAL HIGH (ref 0.44–1.00)
GFR, Estimated: 59 mL/min — ABNORMAL LOW (ref >60)
Glucose, Bld: 119 mg/dL — ABNORMAL HIGH (ref 70–99)
Potassium: 4.6 mmol/L (ref 3.5–5.1)
Sodium: 138 mmol/L (ref 135–145)
Total Bilirubin: 0.5 mg/dL (ref 0.0–1.2)
Total Protein: 8.1 g/dL (ref 6.5–8.1)

## 2023-08-23 LAB — URINALYSIS, ROUTINE W REFLEX MICROSCOPIC
Bilirubin Urine: NEGATIVE
Glucose, UA: NEGATIVE mg/dL
Ketones, ur: NEGATIVE mg/dL
Leukocytes,Ua: NEGATIVE
Nitrite: NEGATIVE
RBC / HPF: >50 RBC/hpf (ref 0–5)
Specific Gravity, Urine: 1.034 — ABNORMAL HIGH (ref 1.005–1.030)
pH: 6 (ref 5.0–8.0)

## 2023-08-23 LAB — CBC
HCT: 40.1 % (ref 36.0–46.0)
Hemoglobin: 13.1 g/dL (ref 12.0–15.0)
MCH: 29.7 pg (ref 26.0–34.0)
MCHC: 32.7 g/dL (ref 30.0–36.0)
MCV: 90.9 fL (ref 80.0–100.0)
Platelets: 341 10*3/uL (ref 150–400)
RBC: 4.41 MIL/uL (ref 3.87–5.11)
RDW: 13 % (ref 11.5–15.5)
WBC: 14.3 10*3/uL — ABNORMAL HIGH (ref 4.0–10.5)
nRBC: 0 % (ref 0.0–0.2)

## 2023-09-18 ENCOUNTER — Encounter (HOSPITAL_COMMUNITY): Payer: Self-pay | Admitting: Psychiatry

## 2023-09-18 ENCOUNTER — Telehealth (INDEPENDENT_AMBULATORY_CARE_PROVIDER_SITE_OTHER): Admitting: Psychiatry

## 2023-09-18 DIAGNOSIS — G479 Sleep disorder, unspecified: Secondary | ICD-10-CM | POA: Diagnosis not present

## 2023-09-18 DIAGNOSIS — F3341 Major depressive disorder, recurrent, in partial remission: Secondary | ICD-10-CM | POA: Diagnosis not present

## 2023-09-18 DIAGNOSIS — F411 Generalized anxiety disorder: Secondary | ICD-10-CM | POA: Diagnosis not present

## 2023-09-18 MED ORDER — ESCITALOPRAM OXALATE 20 MG PO TABS: 20.0000 mg | ORAL_TABLET | Freq: Every day | ORAL | 3 refills | Status: DC

## 2023-09-18 MED ORDER — MELATONIN 5 MG PO TABS: 5.0000 mg | ORAL_TABLET | Freq: Every evening | ORAL | 3 refills | Status: DC | PRN

## 2023-09-18 MED ORDER — HYDROXYZINE HCL 10 MG PO TABS: 10.0000 mg | ORAL_TABLET | Freq: Three times a day (TID) | ORAL | 3 refills | Status: DC | PRN

## 2023-09-18 NOTE — Progress Notes (Signed)
 [facility] MD/PA/NP OP Progress Note Virtual Visit via Video Note  I connected with Debra Herrera on 09/18/23 at  2:30 PM EDT by a video enabled telemedicine application and verified that I am speaking with the correct person using two identifiers.  Location: Patient: Home Provider: Clinic   I discussed the limitations of evaluation and management by telemedicine and the availability of in person appointments. The patient expressed understanding and agreed to proceed.  I provided 30 minutes of non-face-to-face time during this encounter.         09/18/2023 12:57 PM Debra Herrera  MRN:  [id number]  Chief Complaint:  "Everything is chaotic"    HPI: 43 year old female seen for follow up psychiatric evaluation. She has a psychiatric history of anxiety, OCD, and depression. She is currently prescribed Lexapro 20 mg daily, melatonin 5-10 mg as needed, and hydroxyzine 10 mg three times daily for anxiety.  Today she noted that medications are effective in managing her psychiatric conditions.  Today she is well groomed, pleasant, cooperative, engaged in conversation, and maintaining eye contact. She informed Clinical research associate that everything is chaotic. She notes that reports her sleep has been poor. She reports sleeping 4-6 hours.  During the night she reports that she has restless leg, racing thoughts, brain fog, and notes that she wakes up 2-4 times nightly to use the restroom.  Because of her insomnia she notes that her 51 year old son has missed school as she has not been able to get up. Recently patient notes that she has been diagnosed with sleep apnea.  She has a follow up sleep study on April 9th.   Since her last visit she reports that she has been working as a Civil Service fast streamer for [employer].  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  She does note that she worries about finances but reports that currently she has been able to stay current with her bills with her new job, taxes, and her  deceased husband's life insurance.  Despite stressors related to her insomnia and her finances she reports that her anxiety and depression are well-managed.  Provider conducted a GAD-7 and patient scored 6, at her last visit she scored a 10.  Provider also conducted PHQ-9 and patient scored a 11, at her last visit she scored a 10.  She endorses adequate appetite.  Today she denies SI/HI/VAH, mania, paranoia.     No medication changes made today.  Patient agreeable to continue medications as prescribed.  No other concerns at this time.    Visit Diagnosis:    ICD-10-CM   1. Generalized anxiety disorder  F41.1 hydrOXYzine (ATARAX) 10 MG tablet    2. Recurrent major depressive disorder, in partial remission (HCC)  F33.41 escitalopram (LEXAPRO) 20 MG tablet    3. Sleep disturbance  G47.9 melatonin 5 MG TABS        Past Psychiatric History: Anxiety, OCD, and depression  Past Medical History:  Past Medical History:  Diagnosis Date   Anxiety    Depression    Headache(784.0)    Human papilloma virus    Hypercholesteremia    OCD (obsessive compulsive disorder)    TMJ arthralgia    Urinary tract infection     Past Surgical History:  Procedure Laterality Date   ADENOIDECTOMY     TONSILLECTOMY AND ADENOIDECTOMY      Family Psychiatric History: Brother Bipolar disorder, Mother OCD  Family History:  Family History  Problem Relation Age of Onset   Diabetes  Father    Hypertension Father    Hypertension Mother    Other Neg Hx     Social History:  Social History   Socioeconomic History   Marital status: Widowed    Spouse name: Not on file   Number of children: Not on file   Years of education: Not on file   Highest education level: Not on file  Occupational History   Not on file  Tobacco Use   Smoking status: Never   Smokeless tobacco: Never  Vaping Use   Vaping status: Never Used  Substance and Sexual Activity   Alcohol use: No   Drug use: No   Sexual activity: Yes     Partners: Male  Other Topics Concern   Not on file  Social History Narrative   ** Merged History Encounter **       Social Drivers of Health   Financial Resource Strain: Not on file  Food Insecurity: Not on file  Transportation Needs: Not on file  Physical Activity: Not on file  Stress: Not on file  Social Connections: Not on file    Allergies:  Allergies  Allergen Reactions   Benadryl [Diphenhydramine Hcl] Rash   Latex Rash   Sulfa Antibiotics Rash    Metabolic Disorder Labs: No results found for: "HGBA1C", "MPG" No results found for: "PROLACTIN" No results found for: "CHOL", "TRIG", "HDL", "CHOLHDL", "VLDL", "LDLCALC" No results found for: "TSH"  Therapeutic Level Labs: No results found for: "LITHIUM" No results found for: "VALPROATE" No results found for: "CBMZ"  Current Medications: Current Outpatient Medications  Medication Sig Dispense Refill   atorvastatin (LIPITOR) 80 MG tablet Take 80 mg by mouth daily.     azithromycin (ZITHROMAX) 250 MG tablet Take 1 tablet (250 mg total) by mouth daily. Take first 2 tablets together, then 1 every day until finished. (Patient not taking: Reported on 10/29/2022) 6 tablet 0   benzonatate (TESSALON) 100 MG capsule Take 1 capsule (100 mg total) by mouth every 8 (eight) hours as needed for cough. (Patient not taking: Reported on 10/29/2022) 21 capsule 0   escitalopram (LEXAPRO) 20 MG tablet Take 1 tablet (20 mg total) by mouth daily. 30 tablet 3   fexofenadine (ALLEGRA ALLERGY) 180 MG tablet Take 1 tablet (180 mg total) by mouth daily for 15 days. (Patient not taking: Reported on 10/29/2022) 15 tablet 0   hydrOXYzine (ATARAX) 10 MG tablet Take 1 tablet (10 mg total) by mouth 3 (three) times daily as needed. 90 tablet 3   melatonin 5 MG TABS Take 1-2 tablets (5-10 mg total) by mouth at bedtime as needed. 60 tablet 3   meloxicam (MOBIC) 15 MG tablet Take 1 tablet (15 mg total) by mouth daily. 30 tablet 0   methocarbamol (ROBAXIN) 500  MG tablet Take 1 tablet (500 mg total) by mouth every 8 (eight) hours as needed for muscle spasms. 15 tablet 0   methylPREDNISolone (MEDROL DOSEPAK) 4 MG TBPK tablet 6 day dose pack - take as directed 21 tablet 0   Multiple Vitamin (MULTIVITAMIN) tablet Take 1 tablet by mouth daily with breakfast.     ondansetron (ZOFRAN) 4 MG tablet Take 1 tablet (4 mg total) by mouth every 8 (eight) hours as needed for nausea or vomiting. (Patient not taking: Reported on 07/24/2023) 12 tablet 0   TYLENOL 500 MG tablet Take 500-1,000 mg by mouth every 6 (six) hours as needed for mild pain (or headaches).     No current facility-administered medications for  this visit.     Musculoskeletal: Strength & Muscle Tone: within normal limits and telehealth visit Gait & Station: normal, telehealth visit Patient leans: N/A  Psychiatric Specialty Exam: Review of Systems  There were no vitals taken for this visit.There is no height or weight on file to calculate BMI.  General Appearance: Well Groomed  Eye Contact:  Good  Speech:  Clear and Coherent and Normal Rate  Volume:  Normal  Mood:  Euthymic,  Affect:  Appropriate and Congruent  Thought Process:  Coherent, Goal Directed and Linear  Orientation:  Full (Time, Place, and Person)  Thought Content: WDL and Logical   Suicidal Thoughts:  No  Homicidal Thoughts:  No  Memory:  Immediate;   Good Recent;   Good Remote;   Good  Judgement:  Good  Insight:  Good  Psychomotor Activity:  Normal  Concentration:  Concentration: Good and Attention Span: Good  Recall:  Good  Fund of Knowledge: Good  Language: Good  Akathisia:  No  Handed:  Right  AIMS (if indicated): Not done  Assets:  Communication Skills Desire for Improvement Financial Resources/Insurance Housing Social Support  ADL's:  Intact  Cognition: WNL  Sleep:  Fair   Screenings: GAD-7    Flowsheet Row Video Visit from 09/18/2023 in [facility] Video Visit from  06/07/2023 in [facility] Video Visit from 03/27/2023 in [facility] Clinical Support from 12/24/2022 in [facility] Clinical Support from 10/18/2022 in [facility]  Total GAD-7 Score 6 10 12 7 16       PHQ2-9    Flowsheet Row Video Visit from 09/18/2023 in [facility] Video Visit from 06/07/2023 in [facility] Video Visit from 03/27/2023 in [facility] Clinical Support from 12/24/2022 in [facility] Clinical Support from 10/18/2022 in [facility]  PHQ-2 Total Score 1 1 2  0 3  PHQ-9 Total Score 11 10 6 7 11       Flowsheet Row ED from 08/23/2023 in [facility] Emergency Department at [facility] ED from 05/08/2023 in [facility] Emergency Department at [facility] ED from 01/15/2023 in [facility] Emergency Department at [facility]  C-SSRS RISK CATEGORY No Risk No Risk No Risk        Assessment and Plan: Patient reports she continues to have poor sleep.  Recently she was diagnosed with sleep apnea.  Her anxiety and depression are well-managed.   No medication changes made today.  Patient agreeable to continue medications as prescribed. she will follow-up for a sleep study on April 9.    1. Recurrent major depressive disorder, in partial remission (HCC)  Continue- escitalopram (LEXAPRO) 20 MG tablet; Take 1 tablet (20 mg total) by mouth daily.  Dispense: 30 tablet; Refill: 3  2. Generalized anxiety disorder  Continue- - hydrOXYzine (ATARAX) 10 MG tablet; Take 1 tablet (10 mg total) by mouth 3 (three) times daily as needed.  Dispense: 90 tablet; Refill: 3  3. Sleep disturbance  Continue- - melatonin 5 MG TABS; Take 1-2 tablets (5-10 mg total) by mouth at bedtime as needed.  Dispense: 60 tablet; Refill: 3     Follow up in  2 months.    Shanna Cisco, NP 09/18/2023, 12:57 PM

## 2023-10-29 NOTE — Therapy (Signed)
 OUTPATIENT PHYSICAL THERAPY LOWER EXTREMITY EVALUATION   Patient Name: Debra Herrera MRN: [id number] DOB:Jun 14, 1981, 43 y.o., female Today's Date: 10/30/2023  END OF SESSION:  PT End of Session - 10/30/23 1500     Visit Number 1    Number of Visits 12    PT Start Time 1456    PT Stop Time 1525    PT Time Calculation (min) 29 min             Past Medical History:  Diagnosis Date   Anxiety    Depression    Headache(784.0)    Human papilloma virus    Hypercholesteremia    OCD (obsessive compulsive disorder)    TMJ arthralgia    Urinary tract infection    Past Surgical History:  Procedure Laterality Date   ADENOIDECTOMY     TONSILLECTOMY AND ADENOIDECTOMY     Patient Active Problem List   Diagnosis Date Noted   Grief 11/16/2020   Candidiasis of skin 08/16/2020   Cyst of ovary 08/16/2020   Lesion of vulva 08/16/2020   Borderline personality disorder (HCC) 09/18/2018   Major depressive disorder, recurrent episode (HCC) 09/18/2018   Reaction to severe stress, unspecified 09/18/2018   Generalized anxiety disorder 09/18/2018   Amenorrhea, secondary 02/07/2011   DEPRESSIVE DISORDER NOT ELSEWHERE CLASSIFIED 10/12/2008    PCP: no pcp per pt chart  REFERRING PROVIDER: Gaylon Kea, MD  REFERRING DIAG: left knee pain   Rationale for Evaluation and Treatment: Rehabilitation  THERAPY DIAG:  Chronic pain of left knee  Muscle weakness (generalized)  PERTINENT HISTORY: Pre-diabetic  WEIGHT BEARING RESTRICTIONS: No  FALLS:  Has patient fallen in last 6 months? Yes. Number of falls 1, falls occasionally when knee buckles  LIVING ENVIRONMENT: Lives with: lives with their family Lives in: House/apartment Stairs: Yes, difficulties with ambulation Has following equipment at home: None  OCCUPATION: [employer],    PRECAUTIONS:  None ---------------------------------------------------------------------------------------------  SUBJECTIVE:   SUBJECTIVE STATEMENT: Eval statement 10/30/2023:  L knee pain onset past 3 years ago, pain currently 4/10. Pain is worse without medication. Sharp stabbing pain underneath the kneecap.  Last saw an ortho doctor yesterday, was told she the cartilage in her knee is like a road with potholes.  RED FLAGS: None   PLOF: Independent  PATIENT GOALS: strengthen the leg muscles  NEXT MD VISIT: may 21st ---------------------------------------------------------------------------------------------  OBJECTIVE:  Note: Objective measures were completed at Evaluation unless otherwise noted.  DIAGNOSTIC FINDINGS: no recent relevant imaging  PATIENT SURVEYS:  LEFS 42/80  COGNITION: Overall cognitive status: Within functional limits for tasks assessed     SENSATION: Not tested  EDEMA:  None noted, reports occasional swelling  MUSCLE LENGTH: Hamstrings: Right 0  deg; Left 0 deg Pain during L hip ext, could not finish test  POSTURE: rounded shoulders and forward head  PALPATION: Tenderness around L patella   LOWER EXTREMITY ROM:  Passive ROM Right eval Left eval  Hip flexion [facility]!  Hip extension limited limited  Hip abduction    Hip adduction    Hip internal rotation [facility]  Hip external rotation [facility]  Knee flexion [facility]!  Knee extension [facility]  Ankle dorsiflexion    Ankle plantarflexion    Ankle inversion    Ankle eversion     (Blank rows = not tested)  ! Indicates pain with testing  LOWER EXTREMITY MMT:  MMT Right eval Left eval  Hip flexion 4 4  Hip extension 4 4  Hip abduction 4 4  Hip adduction    Hip internal rotation    Hip external rotation    Knee flexion 4 4  Knee extension 4 4  Ankle dorsiflexion    Ankle plantarflexion    Ankle inversion    Ankle eversion     (Blank rows = not tested)  ! Indicates pain with testing  LOWER  EXTREMITY SPECIAL TESTS:  Hip special tests: Portia Brittle (FABER) test: negative Knee special tests: McMurray's test: positive  and Patellafemoral apprehension test: positive   GAIT: Distance walked: 116ft Assistive device utilized: None Level of assistance: Complete Independence Comments: WFL gait quality                                                                                                                                [facility] Adult PT Treatment:                                                DATE: 10/29/2023 Self Care: Pt education, detailed below POC discussion    PATIENT EDUCATION:  Education details: Pt received education regarding HEP performance, ADL performance, functional activity tolerance, impairment education, appropriate performance of therapeutic activities. Person educated: Patient Education method: Explanation, Demonstration, Tactile cues, Verbal cues, and Handouts Education comprehension: verbalized understanding and returned demonstration  HOME EXERCISE PROGRAM: TBD ---------------------------------------------------------------------------------------------  ASSESSMENT:  CLINICAL IMPRESSION: Eval impression (10/29/2023): Pt. attended today's physical therapy session for evaluation of L knee pain. Pt has complaints of bouts of knee buckling with stairs, and unbearable 10/10 pain without medication. Pt has notable deficits with L hip/knee strength, L knee stability, and hip flexor tightness.  Pt would benefit from therapeutic focus on global strengthening, gluteal/quad recruitment during functional motion, and soft tissue extensibility work PRN.  Treatment performed today focused on pt education detailed in obj. Pt demonstrated good understanding of education provided. required minimal verbal/tactile cues and no physical assistance for appropriate performance with today's activities. Pt requires the intervention of skilled outpatient physical therapy to address the  aforementioned deficits and progress towards a functional level in line with therapeutic goals.   OBJECTIVE IMPAIRMENTS: decreased mobility, decreased ROM, decreased strength, impaired flexibility, improper body mechanics, and pain.   ACTIVITY LIMITATIONS: carrying, lifting, stairs, and locomotion level  PARTICIPATION LIMITATIONS: cleaning, laundry, community activity, and occupation  PERSONAL FACTORS: Behavior pattern, Fitness, and Time since onset of injury/illness/exacerbation are also affecting patient's functional outcome.   REHAB POTENTIAL: Fair time since onset  CLINICAL DECISION MAKING: Stable/uncomplicated  EVALUATION COMPLEXITY: Low   GOALS: Goals reviewed with patient? YES  SHORT TERM GOALS: Target date: 11/20/2023 Pt will be independent with administered HEP to demonstrate the competency necessary for long term managemnet of symptoms at home.  Baseline: Goal status: INITIAL   LONG TERM GOALS: Target date: 12/11/2023  Pt.  Will achieve a LEFS score of 60/80 as to demonstrate improvement in self-perceived functional ability with daily activities.  Baseline: 48/80 Goal status: INITIAL  2.  Pt will improve Global Hip/knee strength to a 4+/5 to demonstrate improvement in strength for quality of motion and activity performance.  Baseline: see obj chart Goal status: INITIAL  3.  Pt will report pain levels improving during ADLs to be less than or equal to 3/10 as to demonstrate improved tolerance with daily functional activities such as cleaning around the house.  Baseline: 5/10 with meds Goal status: INITIAL  --------------------------------------------------------------------------------------------- PLAN:  PT FREQUENCY: 1-2x/week  PT DURATION: 6 weeks  PLANNED INTERVENTIONS: 97110-Therapeutic exercises, 97530- Therapeutic activity, 97112- Neuromuscular re-education, 97535- Self Care, 16109- Manual therapy, [phone]- Gait training, Patient/Family education, Balance  training, Stair training, Taping, Dry Needling, and Joint mobilization  PLAN FOR NEXT SESSION: Review HEP, Begin POC as detailed in the assessment   Albesa Huguenin, PT, DPT 10/30/2023, 3:28 PM

## 2023-10-30 ENCOUNTER — Other Ambulatory Visit: Payer: Self-pay

## 2023-10-30 ENCOUNTER — Encounter: Payer: Self-pay | Admitting: Physical Therapy

## 2023-10-30 ENCOUNTER — Ambulatory Visit: Attending: Orthopedic Surgery | Admitting: Physical Therapy

## 2023-10-30 DIAGNOSIS — M25562 Pain in left knee: Secondary | ICD-10-CM | POA: Insufficient documentation

## 2023-10-30 DIAGNOSIS — G8929 Other chronic pain: Secondary | ICD-10-CM | POA: Insufficient documentation

## 2023-10-30 DIAGNOSIS — M6281 Muscle weakness (generalized): Secondary | ICD-10-CM | POA: Diagnosis present

## 2023-11-07 ENCOUNTER — Encounter: Payer: Self-pay | Admitting: Physical Therapy

## 2023-11-07 ENCOUNTER — Ambulatory Visit: Admitting: Physical Therapy

## 2023-11-07 DIAGNOSIS — G8929 Other chronic pain: Secondary | ICD-10-CM

## 2023-11-07 DIAGNOSIS — M25562 Pain in left knee: Secondary | ICD-10-CM | POA: Diagnosis not present

## 2023-11-07 DIAGNOSIS — M6281 Muscle weakness (generalized): Secondary | ICD-10-CM

## 2023-11-07 NOTE — Therapy (Signed)
 OUTPATIENT PHYSICAL THERAPY TREATMENT NOTE   Patient Name: Debra Herrera MRN: [id number] DOB:1980-06-28, 43 y.o., female Today's Date: 11/07/2023  END OF SESSION:  PT End of Session - 11/07/23 1516     Visit Number 2    Number of Visits 12    Date for PT Re-Evaluation 12/12/23    PT Start Time 1452    PT Stop Time 1530    PT Time Calculation (min) 38 min              Past Medical History:  Diagnosis Date   Anxiety    Depression    Headache(784.0)    Human papilloma virus    Hypercholesteremia    OCD (obsessive compulsive disorder)    TMJ arthralgia    Urinary tract infection    Past Surgical History:  Procedure Laterality Date   ADENOIDECTOMY     TONSILLECTOMY AND ADENOIDECTOMY     Patient Active Problem List   Diagnosis Date Noted   Grief 11/16/2020   Candidiasis of skin 08/16/2020   Cyst of ovary 08/16/2020   Lesion of vulva 08/16/2020   Borderline personality disorder (HCC) 09/18/2018   Major depressive disorder, recurrent episode (HCC) 09/18/2018   Reaction to severe stress, unspecified 09/18/2018   Generalized anxiety disorder 09/18/2018   Amenorrhea, secondary 02/07/2011   DEPRESSIVE DISORDER NOT ELSEWHERE CLASSIFIED 10/12/2008    PCP: no pcp per pt chart  REFERRING PROVIDER: Gaylon Kea, MD  REFERRING DIAG: left knee pain   Rationale for Evaluation and Treatment: Rehabilitation  THERAPY DIAG:  Chronic pain of left knee  Muscle weakness (generalized)  PERTINENT HISTORY: Pre-diabetic  WEIGHT BEARING RESTRICTIONS: No  FALLS:  Has patient fallen in last 6 months? Yes. Number of falls 1, falls occasionally when knee buckles  LIVING ENVIRONMENT: Lives with: lives with their family Lives in: House/apartment Stairs: Yes, difficulties with ambulation Has following equipment at home: None  OCCUPATION: [employer],    PRECAUTIONS:  None ---------------------------------------------------------------------------------------------  SUBJECTIVE:   SUBJECTIVE STATEMENT: Pt attended today's session with reports of 0/10 pain.  Has had a lot of off days this week.   Eval statement 10/30/2023:  L knee pain onset past 3 years ago, pain currently 4/10. Pain is worse without medication. Sharp stabbing pain underneath the kneecap.  Last saw an ortho doctor yesterday, was told she the cartilage in her knee is like a road with potholes.  RED FLAGS: None   PLOF: Independent  PATIENT GOALS: strengthen the leg muscles  NEXT MD VISIT: may 21st ---------------------------------------------------------------------------------------------  OBJECTIVE:  Note: Objective measures were completed at Evaluation unless otherwise noted.  DIAGNOSTIC FINDINGS: no recent relevant imaging  PATIENT SURVEYS:  LEFS 42/80  COGNITION: Overall cognitive status: Within functional limits for tasks assessed     SENSATION: Not tested  EDEMA:  None noted, reports occasional swelling  MUSCLE LENGTH: Hamstrings: Right 0  deg; Left 0 deg Pain during L hip ext, could not finish test  POSTURE: rounded shoulders and forward head  PALPATION: Tenderness around L patella   LOWER EXTREMITY ROM:  Passive ROM Right eval Left eval  Hip flexion [facility]!  Hip extension limited limited  Hip abduction    Hip adduction    Hip internal rotation [facility]  Hip external rotation [facility]  Knee flexion [facility]!  Knee extension [facility]  Ankle dorsiflexion    Ankle plantarflexion    Ankle inversion    Ankle eversion     (Blank rows =  not tested)  ! Indicates pain with testing  LOWER EXTREMITY MMT:  MMT Right eval Left eval  Hip flexion 4 4  Hip extension 4 4  Hip abduction 4 4  Hip adduction    Hip internal rotation    Hip external rotation    Knee flexion 4 4  Knee extension 4 4  Ankle dorsiflexion    Ankle plantarflexion    Ankle  inversion    Ankle eversion     (Blank rows = not tested)  ! Indicates pain with testing  LOWER EXTREMITY SPECIAL TESTS:  Hip special tests: Portia Brittle (FABER) test: negative Knee special tests: McMurray's test: positive  and Patellafemoral apprehension test: positive   GAIT: Distance walked: 130ft Assistive device utilized: None Level of assistance: Complete Independence Comments: WFL gait quality    [facility] Adult PT Treatment:                                                DATE: 11/07/2023  Therapeutic Exercise: Rec bike 5' Supine hip flexor stretch w/strap 3x1' Therapeutic Activity: Supine bridge with resistance 2x12, hold 3s, GTB Supine SLR 2x8, hold 5s (regressed to 3s hold) PT education surrounding anatomy, physiology of hip/knee musculature, and HEP update                                                                                                                        [facility] Adult PT Treatment:                                                DATE: 10/29/2023 Self Care: Pt education, detailed below POC discussion    PATIENT EDUCATION:  Education details: Pt received education regarding HEP performance, ADL performance, functional activity tolerance, impairment education, appropriate performance of therapeutic activities. Person educated: Patient Education method: Explanation, Demonstration, Tactile cues, Verbal cues, and Handouts Education comprehension: verbalized understanding and returned demonstration  HOME EXERCISE PROGRAM: Access Code: [email or web address] URL: [email or web address] Date: 11/07/2023 Prepared by: Albesa Huguenin  Exercises - Supine Quadriceps Stretch with Strap on Table  - 1-2 x daily - 2-3 sets - 1 reps - 39m hold - Supine Bridge with Resistance Band  - 1 x daily - 4 x weekly - 2-3 sets - 12 reps - 3s hold - Active Straight Leg Raise with Quad Set  - 1 x daily - 4-7 x weekly - 3 sets - 10 reps - 5s  hold ---------------------------------------------------------------------------------------------  ASSESSMENT:  CLINICAL IMPRESSION: Pt attended physical therapy session for continuation of treatment regarding L knee pain. Today's treatment focused on improvement of  global hip knee strength, L knee stability, and general activity tolerance. Pt showed  fair tolerance to administered treatment with no adverse  effects by the end of session.Pt required additional time set aside for rest and exercise performance. Skilled intervention was utilized via activity modification for pt tolerance with task completion, functional progression/regression promoting best outcomes inline with current rehab goals, as well as moderate verbal/tactile cuing alongside no physical assistance for safe and appropriate performance of today's activities. Continue with therapeutic focus on activity tolerance, quad activation/motility, and global hip/knee strengthening, progressing as tolerated. Review updated HEP tolerance next session  Eval impression (10/29/2023): Pt. attended today's physical therapy session for evaluation of L knee pain. Pt has complaints of bouts of knee buckling with stairs, and unbearable 10/10 pain without medication. Pt has notable deficits with L hip/knee strength, L knee stability, and hip flexor tightness.  Pt would benefit from therapeutic focus on global strengthening, gluteal/quad recruitment during functional motion, and soft tissue extensibility work PRN.  Treatment performed today focused on pt education detailed in obj. Pt demonstrated good understanding of education provided. required minimal verbal/tactile cues and no physical assistance for appropriate performance with today's activities. Pt requires the intervention of skilled outpatient physical therapy to address the aforementioned deficits and progress towards a functional level in line with therapeutic goals.   OBJECTIVE IMPAIRMENTS:  decreased mobility, decreased ROM, decreased strength, impaired flexibility, improper body mechanics, and pain.   ACTIVITY LIMITATIONS: carrying, lifting, stairs, and locomotion level  PARTICIPATION LIMITATIONS: cleaning, laundry, community activity, and occupation  PERSONAL FACTORS: Behavior pattern, Fitness, and Time since onset of injury/illness/exacerbation are also affecting patient's functional outcome.   REHAB POTENTIAL: Fair time since onset  CLINICAL DECISION MAKING: Stable/uncomplicated  EVALUATION COMPLEXITY: Low   GOALS: Goals reviewed with patient? YES  SHORT TERM GOALS: Target date: 11/20/2023 Pt will be independent with administered HEP to demonstrate the competency necessary for long term managemnet of symptoms at home.  Baseline: Goal status: INITIAL   LONG TERM GOALS: Target date: 12/11/2023  Pt. Will achieve a LEFS score of 60/80 as to demonstrate improvement in self-perceived functional ability with daily activities.  Baseline: 48/80 Goal status: INITIAL  2.  Pt will improve Global Hip/knee strength to a 4+/5 to demonstrate improvement in strength for quality of motion and activity performance.  Baseline: see obj chart Goal status: INITIAL  3.  Pt will report pain levels improving during ADLs to be less than or equal to 3/10 as to demonstrate improved tolerance with daily functional activities such as cleaning around the house.  Baseline: 5/10 with meds Goal status: INITIAL  --------------------------------------------------------------------------------------------- PLAN:  PT FREQUENCY: 1-2x/week  PT DURATION: 6 weeks  PLANNED INTERVENTIONS: 97110-Therapeutic exercises, 97530- Therapeutic activity, 97112- Neuromuscular re-education, 97535- Self Care, 16109- Manual therapy, [phone]- Gait training, Patient/Family education, Balance training, Stair training, Taping, Dry Needling, and Joint mobilization  PLAN FOR NEXT SESSION: Continue with therapeutic  focus on activity tolerance, quad activation/motility, and global hip/knee strengthening, progressing as tolerated. Review updated HEP tolerance next session   Albesa Huguenin, PT, DPT 11/07/2023, 3:40 PM

## 2023-11-14 ENCOUNTER — Ambulatory Visit: Admitting: Physical Therapy

## 2023-11-18 ENCOUNTER — Telehealth (INDEPENDENT_AMBULATORY_CARE_PROVIDER_SITE_OTHER): Admitting: Psychiatry

## 2023-11-18 ENCOUNTER — Encounter (HOSPITAL_COMMUNITY): Payer: Self-pay | Admitting: Psychiatry

## 2023-11-18 DIAGNOSIS — F3341 Major depressive disorder, recurrent, in partial remission: Secondary | ICD-10-CM

## 2023-11-18 DIAGNOSIS — F411 Generalized anxiety disorder: Secondary | ICD-10-CM

## 2023-11-18 MED ORDER — ESCITALOPRAM OXALATE 20 MG PO TABS: 20.0000 mg | ORAL_TABLET | Freq: Every day | ORAL | 3 refills | Status: DC

## 2023-11-18 MED ORDER — HYDROXYZINE HCL 10 MG PO TABS: 10.0000 mg | ORAL_TABLET | Freq: Three times a day (TID) | ORAL | 3 refills | Status: DC | PRN

## 2023-11-18 NOTE — Progress Notes (Signed)
 [facility] MD/PA/NP OP Progress Note Virtual Visit via Video Note  I connected with Debra Herrera on 11/18/23 at  3:00 PM EDT by a video enabled telemedicine application and verified that I am speaking with the correct person using two identifiers.  Location: Patient: Home Provider: Clinic   I discussed the limitations of evaluation and management by telemedicine and the availability of in person appointments. The patient expressed understanding and agreed to proceed.  I provided 30 minutes of non-face-to-face time during this encounter.         11/18/2023 11:14 AM Debra Herrera  MRN:  [id number]  Chief Complaint:  "Things are going okay"    HPI: 43 year old female seen for follow up psychiatric evaluation. She has a psychiatric history of anxiety, OCD, and depression. She is currently prescribed Lexapro  20 mg daily, melatonin 5-10 mg as needed (OTC), and hydroxyzine  10 mg three times daily for anxiety.  Today she noted that medications are effective in managing her psychiatric conditions.  Today she is well groomed, pleasant, cooperative, engaged in conversation, and maintaining eye contact. She informed Clinical research associate that things are going okay. She does note that financially she is stressed. She notes that she is works at [employer] but does not make enough. She does note that her significant other helps. Physically patient notes that she is doing okay. She reports that she spoke to her sleep pathologist who reccommended she lose 21 pounds to help manage her sleep apnea. Patient does note that she is now sleeping 8 hour. She notes that she is not experiencing much brain fog,  racing thoughts, and restlessness at night.  Patient notes that her children are doing well.  At her last visit she notes that her anxiety and depression continues to be well-managed.  She reports that she finds supporting her significant other Tim.  Today provider conducted a GAD-7 and patient scored 4, at  her last visit she scored a 6.  Provider also conducted PHQ-9 and patient scored a 3, at her last visit she scored a 11.  She endorses adequate appetite.  Today she denies SI/HI/VAH, mania, paranoia.     No medication changes made today.  Patient agreeable to continue medications as prescribed.  No other concerns at this time.    Visit Diagnosis:    ICD-10-CM   1. Generalized anxiety disorder  F41.1 hydrOXYzine  (ATARAX ) 10 MG tablet    2. Recurrent major depressive disorder, in partial remission (HCC)  F33.41 escitalopram  (LEXAPRO ) 20 MG tablet    3. Sleep disturbance  G47.9          Past Psychiatric History: Anxiety, OCD, and depression  Past Medical History:  Past Medical History:  Diagnosis Date   Anxiety    Depression    Headache(784.0)    Human papilloma virus    Hypercholesteremia    OCD (obsessive compulsive disorder)    TMJ arthralgia    Urinary tract infection     Past Surgical History:  Procedure Laterality Date   ADENOIDECTOMY     TONSILLECTOMY AND ADENOIDECTOMY      Family Psychiatric History: Brother Bipolar disorder, Mother OCD  Family History:  Family History  Problem Relation Age of Onset   Diabetes Father    Hypertension Father    Hypertension Mother    Other Neg Hx     Social History:  Social History   Socioeconomic History   Marital status: Widowed    Spouse name: Not on file   Number of children:  Not on file   Years of education: Not on file   Highest education level: Not on file  Occupational History   Not on file  Tobacco Use   Smoking status: Never   Smokeless tobacco: Never  Vaping Use   Vaping status: Never Used  Substance and Sexual Activity   Alcohol use: No   Drug use: No   Sexual activity: Yes    Partners: Male  Other Topics Concern   Not on file  Social History Narrative   ** Merged History Encounter **       Social Drivers of Health   Financial Resource Strain: Not on file  Food Insecurity: Not on file   Transportation Needs: Not on file  Physical Activity: Not on file  Stress: Not on file  Social Connections: Not on file    Allergies:  Allergies  Allergen Reactions   Benadryl  [Diphenhydramine  Hcl] Rash   Latex Rash   Sulfa Antibiotics Rash    Metabolic Disorder Labs: No results found for: "HGBA1C", "MPG" No results found for: "PROLACTIN" No results found for: "CHOL", "TRIG", "HDL", "CHOLHDL", "VLDL", "LDLCALC" No results found for: "TSH"  Therapeutic Level Labs: No results found for: "LITHIUM" No results found for: "VALPROATE" No results found for: "CBMZ"  Current Medications: Current Outpatient Medications  Medication Sig Dispense Refill   atorvastatin (LIPITOR) 80 MG tablet Take 80 mg by mouth daily.     azithromycin  (ZITHROMAX ) 250 MG tablet Take 1 tablet (250 mg total) by mouth daily. Take first 2 tablets together, then 1 every day until finished. (Patient not taking: Reported on 10/29/2022) 6 tablet 0   benzonatate  (TESSALON ) 100 MG capsule Take 1 capsule (100 mg total) by mouth every 8 (eight) hours as needed for cough. (Patient not taking: Reported on 10/29/2022) 21 capsule 0   escitalopram  (LEXAPRO ) 20 MG tablet Take 1 tablet (20 mg total) by mouth daily. 30 tablet 3   fexofenadine  (ALLEGRA  ALLERGY) 180 MG tablet Take 1 tablet (180 mg total) by mouth daily for 15 days. (Patient not taking: Reported on 10/29/2022) 15 tablet 0   hydrOXYzine  (ATARAX ) 10 MG tablet Take 1 tablet (10 mg total) by mouth 3 (three) times daily as needed. 90 tablet 3   meloxicam  (MOBIC ) 15 MG tablet Take 1 tablet (15 mg total) by mouth daily. 30 tablet 0   methocarbamol  (ROBAXIN ) 500 MG tablet Take 1 tablet (500 mg total) by mouth every 8 (eight) hours as needed for muscle spasms. 15 tablet 0   methylPREDNISolone  (MEDROL  DOSEPAK) 4 MG TBPK tablet 6 day dose pack - take as directed 21 tablet 0   Multiple Vitamin (MULTIVITAMIN) tablet Take 1 tablet by mouth daily with breakfast.     ondansetron   (ZOFRAN ) 4 MG tablet Take 1 tablet (4 mg total) by mouth every 8 (eight) hours as needed for nausea or vomiting. (Patient not taking: Reported on 07/24/2023) 12 tablet 0   TYLENOL  500 MG tablet Take 500-1,000 mg by mouth every 6 (six) hours as needed for mild pain (or headaches).     No current facility-administered medications for this visit.     Musculoskeletal: Strength & Muscle Tone: within normal limits and telehealth visit Gait & Station: normal, telehealth visit Patient leans: N/A  Psychiatric Specialty Exam: Review of Systems  There were no vitals taken for this visit.There is no height or weight on file to calculate BMI.  General Appearance: Well Groomed  Eye Contact:  Good  Speech:  Clear and Coherent and  Normal Rate  Volume:  Normal  Mood:  Euthymic,  Affect:  Appropriate and Congruent  Thought Process:  Coherent, Goal Directed and Linear  Orientation:  Full (Time, Place, and Person)  Thought Content: WDL and Logical   Suicidal Thoughts:  No  Homicidal Thoughts:  No  Memory:  Immediate;   Good Recent;   Good Remote;   Good  Judgement:  Good  Insight:  Good  Psychomotor Activity:  Normal  Concentration:  Concentration: Good and Attention Span: Good  Recall:  Good  Fund of Knowledge: Good  Language: Good  Akathisia:  No  Handed:  Right  AIMS (if indicated): Not done  Assets:  Communication Skills Desire for Improvement Financial Resources/Insurance Housing Social Support  ADL's:  Intact  Cognition: WNL  Sleep:  Good   Screenings: GAD-7    Flowsheet Row Video Visit from 11/18/2023 in [facility] Video Visit from 09/18/2023 in [facility] Video Visit from 06/07/2023 in [facility] Video Visit from 03/27/2023 in [facility] Clinical Support from 12/24/2022 in [facility]  Total GAD-7 Score 4 6 10 12 7       PHQ2-9     Flowsheet Row Video Visit from 11/18/2023 in [facility] Video Visit from 09/18/2023 in [facility] Video Visit from 06/07/2023 in [facility] Video Visit from 03/27/2023 in [facility] Clinical Support from 12/24/2022 in [facility]  PHQ-2 Total Score 0 1 1 2  0  PHQ-9 Total Score 3 11 10 6 7       Flowsheet Row ED from 08/23/2023 in [facility] Department at [facility] ED from 05/08/2023 in [facility] Emergency Department at [facility] ED from 01[facility]15/2023 in [facility] Emergency Department at [facility]  C-SSRS RISK CATEGORY No Risk No Risk No Risk        Assessment and Plan: Patient reports her sleep, anxiety, and depression have improved since her last visit.  She is now sleeping 8 hours and not experiencing brain fog, restlessness, or racing thoughts.  No medication changes made today.  Patient agreeable to continue medication as prescribed.  1. Generalized anxiety disorder  Continue- hydrOXYzine  (ATARAX ) 10 MG tablet; Take 1 tablet (10 mg total) by mouth 3 (three) times daily as needed.  Dispense: 90 tablet; Refill: 3  2. Recurrent major depressive disorder, in partial remission (HCC)  Continue- escitalopram  (LEXAPRO ) 20 MG tablet; Take 1 tablet (20 mg total) by mouth daily.  Dispense: 30 tablet; Refill: 3      Follow up in 2 months.    Arlyne Bering, NP 11/18/2023, 11:14 AM

## 2023-11-19 ENCOUNTER — Encounter: Payer: Self-pay | Admitting: Physical Therapy

## 2023-11-19 ENCOUNTER — Ambulatory Visit: Attending: Orthopedic Surgery | Admitting: Physical Therapy

## 2023-11-19 DIAGNOSIS — M25562 Pain in left knee: Secondary | ICD-10-CM | POA: Diagnosis present

## 2023-11-19 DIAGNOSIS — G8929 Other chronic pain: Secondary | ICD-10-CM | POA: Insufficient documentation

## 2023-11-19 DIAGNOSIS — M6281 Muscle weakness (generalized): Secondary | ICD-10-CM | POA: Diagnosis present

## 2023-11-19 NOTE — Therapy (Signed)
 OUTPATIENT PHYSICAL THERAPY TREATMENT NOTE   Patient Name: Debra Herrera MRN: [id number] DOB:[birth year], 43 y.o., female Today's Date: 11/19/2023  END OF SESSION:  PT End of Session - 11/19/23 1452     Visit Number 3    Number of Visits 12    Date for PT Re-Evaluation 12/12/23    PT Start Time 1402    PT Stop Time 1440    PT Time Calculation (min) 38 min    Activity Tolerance Patient tolerated treatment well    Behavior During Therapy WFL for tasks assessed/performed              Past Medical History:  Diagnosis Date   Anxiety    Depression    Headache(784.0)    Human papilloma virus    Hypercholesteremia    OCD (obsessive compulsive disorder)    TMJ arthralgia    Urinary tract infection    Past Surgical History:  Procedure Laterality Date   ADENOIDECTOMY     TONSILLECTOMY AND ADENOIDECTOMY     Patient Active Problem List   Diagnosis Date Noted   Grief 11/16/2020   Candidiasis of skin 08/16/2020   Cyst of ovary 08/16/2020   Lesion of vulva 08/16/2020   Borderline personality disorder (HCC) 09/18/2018   Major depressive disorder, recurrent episode (HCC) 09/18/2018   Reaction to severe stress, unspecified 09/18/2018   Generalized anxiety disorder 09/18/2018   Amenorrhea, secondary 02/07/2011   DEPRESSIVE DISORDER NOT ELSEWHERE CLASSIFIED 10/12/2008    PCP: no pcp per pt chart  REFERRING PROVIDER: Gaylon Kea, MD  REFERRING DIAG: left knee pain   Rationale for Evaluation and Treatment: Rehabilitation  THERAPY DIAG:  Chronic pain of left knee  Muscle weakness (generalized)  PERTINENT HISTORY: Pre-diabetic  WEIGHT BEARING RESTRICTIONS: No  FALLS:  Has patient fallen in last 6 months? Yes. Number of falls 1, falls occasionally when knee buckles  LIVING ENVIRONMENT: Lives with: lives with their family Lives in: House/apartment Stairs: Yes, difficulties with ambulation Has following equipment at home: None  OCCUPATION: [employer],    PRECAUTIONS: None ---------------------------------------------------------------------------------------------  SUBJECTIVE:   SUBJECTIVE STATEMENT: Pt attended today's session with reports of 0/10 pain.  Hasn't performed HEP.   Eval statement 10/30/2023:  L knee pain onset past 3 years ago, pain currently 4/10. Pain is worse without medication. Sharp stabbing pain underneath the kneecap.  Last saw an ortho doctor yesterday, was told she the cartilage in her knee is like a road with potholes.  RED FLAGS: None   PLOF: Independent  PATIENT GOALS: strengthen the leg muscles  NEXT MD VISIT: may 21st ---------------------------------------------------------------------------------------------  OBJECTIVE:  Note: Objective measures were completed at Evaluation unless otherwise noted.  DIAGNOSTIC FINDINGS: no recent relevant imaging  PATIENT SURVEYS:  LEFS 42/80  COGNITION: Overall cognitive status: Within functional limits for tasks assessed     SENSATION: Not tested  EDEMA:  None noted, reports occasional swelling  MUSCLE LENGTH: Hamstrings: Right 0  deg; Left 0 deg Pain during L hip ext, could not finish test  POSTURE: rounded shoulders and forward head  PALPATION: Tenderness around L patella   LOWER EXTREMITY ROM:  Passive ROM Right eval Left eval  Hip flexion [facility]!  Hip extension limited limited  Hip abduction    Hip adduction    Hip internal rotation [facility]  Hip external rotation [facility]  Knee flexion [facility]!  Knee extension [facility]  Ankle dorsiflexion    Ankle plantarflexion  Ankle inversion    Ankle eversion     (Blank rows = not tested)  ! Indicates pain with testing  LOWER EXTREMITY MMT:  MMT Right eval Left eval  Hip flexion 4 4  Hip extension 4 4  Hip abduction 4 4  Hip adduction    Hip internal rotation    Hip external rotation    Knee flexion 4 4  Knee extension 4 4  Ankle dorsiflexion    Ankle  plantarflexion    Ankle inversion    Ankle eversion     (Blank rows = not tested)  ! Indicates pain with testing  LOWER EXTREMITY SPECIAL TESTS:  Hip special tests: Portia Brittle (FABER) test: negative Knee special tests: McMurray's test: positive  and Patellafemoral apprehension test: positive   GAIT: Distance walked: 143ft Assistive device utilized: None Level of assistance: Complete Independence Comments: WFL gait quality   [facility] Adult PT Treatment:                                                DATE: 11/19/2023 Therapeutic Activity: Nustep 8' for activity tolerance Slant board single leg heel raise 2x15 B, hold 1s Supine SLR, LLE only 2x12, 5s hold Supine bridge against BuTB 1x10 (pt requested to not progress today)  Pt requires additional time for answering questions, exercise performance, and rest.   [facility] Adult PT Treatment:                                                DATE: 11/07/2023  Therapeutic Exercise: Rec bike 5' Supine hip flexor stretch w/strap 3x1' Therapeutic Activity: Supine bridge with resistance 2x12, hold 3s, GTB Supine SLR 2x8, hold 5s (regressed to 3s hold) PT education surrounding anatomy, physiology of hip/knee musculature, and HEP update                                                                                                                        [facility] Adult PT Treatment:                                                DATE: 10/29/2023 Self Care: Pt education, detailed below POC discussion    PATIENT EDUCATION:  Education details: Pt received education regarding HEP performance, ADL performance, functional activity tolerance, impairment education, appropriate performance of therapeutic activities. Person educated: Patient Education method: Explanation, Demonstration, Tactile cues, Verbal cues, and Handouts Education comprehension: verbalized understanding and returned demonstration  HOME EXERCISE PROGRAM: Access Code: [email or web address] URL:  [email or web address] Date: 11/07/2023 Prepared by: Albesa Huguenin  Exercises - Supine Quadriceps Stretch with  Strap on Table  - 1-2 x daily - 2-3 sets - 1 reps - 61m hold - Supine Bridge with Resistance Band  - 1 x daily - 4 x weekly - 2-3 sets - 12 reps - 3s hold - Active Straight Leg Raise with Quad Set  - 1 x daily - 4-7 x weekly - 3 sets - 10 reps - 5s hold ---------------------------------------------------------------------------------------------  ASSESSMENT:  CLINICAL IMPRESSION: Pt attended physical therapy session for continuation of treatment regarding L knee pain. Today's treatment focused on improvement of  global hip knee strength, L knee stability, and general activity tolerance. Pt continues to request minimal progression during sessions. Pt showed good tolerance to administered treatment with no adverse effects by the end of session. Pt required additional time set aside for rest and exercise performance. Skilled intervention was utilized via activity modification for pt tolerance with task completion, functional progression/regression promoting best outcomes inline with current rehab goals, as well as moderate verbal/tactile cuing alongside no physical assistance for safe and appropriate performance of today's activities. Continue with therapeutic focus on activity tolerance, quad activation/motility, and global hip/knee strengthening, progressing as tolerated.   Eval impression (10/29/2023): Pt. attended today's physical therapy session for evaluation of L knee pain. Pt has complaints of bouts of knee buckling with stairs, and unbearable 10/10 pain without medication. Pt has notable deficits with L hip/knee strength, L knee stability, and hip flexor tightness.  Pt would benefit from therapeutic focus on global strengthening, gluteal/quad recruitment during functional motion, and soft tissue extensibility work PRN.  Treatment performed today focused on pt education  detailed in obj. Pt demonstrated good understanding of education provided. required minimal verbal/tactile cues and no physical assistance for appropriate performance with today's activities. Pt requires the intervention of skilled outpatient physical therapy to address the aforementioned deficits and progress towards a functional level in line with therapeutic goals.   OBJECTIVE IMPAIRMENTS: decreased mobility, decreased ROM, decreased strength, impaired flexibility, improper body mechanics, and pain.   ACTIVITY LIMITATIONS: carrying, lifting, stairs, and locomotion level  PARTICIPATION LIMITATIONS: cleaning, laundry, community activity, and occupation  PERSONAL FACTORS: Behavior pattern, Fitness, and Time since onset of injury/illness/exacerbation are also affecting patient's functional outcome.   REHAB POTENTIAL: Fair time since onset  CLINICAL DECISION MAKING: Stable/uncomplicated  EVALUATION COMPLEXITY: Low   GOALS: Goals reviewed with patient? YES  SHORT TERM GOALS: Target date: 11/20/2023 Pt will be independent with administered HEP to demonstrate the competency necessary for long term managemnet of symptoms at home.  Baseline: Goal status: INITIAL   LONG TERM GOALS: Target date: 12/11/2023  Pt. Will achieve a LEFS score of 60/80 as to demonstrate improvement in self-perceived functional ability with daily activities.  Baseline: 48/80 Goal status: INITIAL  2.  Pt will improve Global Hip/knee strength to a 4+/5 to demonstrate improvement in strength for quality of motion and activity performance.  Baseline: see obj chart Goal status: INITIAL  3.  Pt will report pain levels improving during ADLs to be less than or equal to 3/10 as to demonstrate improved tolerance with daily functional activities such as cleaning around the house.  Baseline: 5/10 with meds Goal status:  INITIAL  --------------------------------------------------------------------------------------------- PLAN:  PT FREQUENCY: 1-2x/week  PT DURATION: 6 weeks  PLANNED INTERVENTIONS: 97110-Therapeutic exercises, 97530- Therapeutic activity, 97112- Neuromuscular re-education, 97535- Self Care, 16109- Manual therapy, [phone]- Gait training, Patient/Family education, Balance training, Stair training, Taping, Dry Needling, and Joint mobilization  PLAN FOR NEXT SESSION: Continue with therapeutic focus on activity tolerance, quad  activation/motility, and global hip/knee strengthening, progressing as tolerated. Review updated HEP tolerance next session   Albesa Huguenin, PT, DPT 11/19/2023, 3:00 PM

## 2023-11-19 NOTE — Therapy (Deleted)
 OUTPATIENT PHYSICAL THERAPY TREATMENT NOTE   Patient Name: Debra Herrera MRN: [id number] DOB:December 27, 1980, 43 y.o., female Today's Date: 11/19/2023  END OF SESSION:     Past Medical History:  Diagnosis Date   Anxiety    Depression    Headache(784.0)    Human papilloma virus    Hypercholesteremia    OCD (obsessive compulsive disorder)    TMJ arthralgia    Urinary tract infection    Past Surgical History:  Procedure Laterality Date   ADENOIDECTOMY     TONSILLECTOMY AND ADENOIDECTOMY     Patient Active Problem List   Diagnosis Date Noted   Grief 11/16/2020   Candidiasis of skin 08/16/2020   Cyst of ovary 08/16/2020   Lesion of vulva 08/16/2020   Borderline personality disorder (HCC) 09/18/2018   Major depressive disorder, recurrent episode (HCC) 09/18/2018   Reaction to severe stress, unspecified 09/18/2018   Generalized anxiety disorder 09/18/2018   Amenorrhea, secondary 02/07/2011   DEPRESSIVE DISORDER NOT ELSEWHERE CLASSIFIED 10/12/2008    PCP: no pcp per pt chart  REFERRING PROVIDER: Gaylon Kea, MD  REFERRING DIAG: left knee pain   Rationale for Evaluation and Treatment: Rehabilitation  THERAPY DIAG:  No diagnosis found.  PERTINENT HISTORY: Pre-diabetic  WEIGHT BEARING RESTRICTIONS: No  FALLS:  Has patient fallen in last 6 months? Yes. Number of falls 1, falls occasionally when knee buckles  LIVING ENVIRONMENT: Lives with: lives with their family Lives in: House/apartment Stairs: Yes, difficulties with ambulation Has following equipment at home: None  OCCUPATION: [employer],    PRECAUTIONS: None ---------------------------------------------------------------------------------------------  SUBJECTIVE:   SUBJECTIVE STATEMENT: Pt attended today's session with reports of 0/10 pain.  Has had a lot of off days this week.   Eval statement 10/30/2023:  L knee pain onset past 3 years ago, pain currently 4/10. Pain is  worse without medication. Sharp stabbing pain underneath the kneecap.  Last saw an ortho doctor yesterday, was told she the cartilage in her knee is like a road with potholes.  RED FLAGS: None   PLOF: Independent  PATIENT GOALS: strengthen the leg muscles  NEXT MD VISIT: may 21st ---------------------------------------------------------------------------------------------  OBJECTIVE:  Note: Objective measures were completed at Evaluation unless otherwise noted.  DIAGNOSTIC FINDINGS: no recent relevant imaging  PATIENT SURVEYS:  LEFS 42/80  COGNITION: Overall cognitive status: Within functional limits for tasks assessed     SENSATION: Not tested  EDEMA:  None noted, reports occasional swelling  MUSCLE LENGTH: Hamstrings: Right 0  deg; Left 0 deg Pain during L hip ext, could not finish test  POSTURE: rounded shoulders and forward head  PALPATION: Tenderness around L patella   LOWER EXTREMITY ROM:  Passive ROM Right eval Left eval  Hip flexion [facility]!  Hip extension limited limited  Hip abduction    Hip adduction    Hip internal rotation [facility]  Hip external rotation [facility]  Knee flexion [facility]!  Knee extension [facility]  Ankle dorsiflexion    Ankle plantarflexion    Ankle inversion    Ankle eversion     (Blank rows = not tested)  ! Indicates pain with testing  LOWER EXTREMITY MMT:  MMT Right eval Left eval  Hip flexion 4 4  Hip extension 4 4  Hip abduction 4 4  Hip adduction    Hip internal rotation    Hip external rotation    Knee flexion 4 4  Knee extension 4 4  Ankle dorsiflexion    Ankle  plantarflexion    Ankle inversion    Ankle eversion     (Blank rows = not tested)  ! Indicates pain with testing  LOWER EXTREMITY SPECIAL TESTS:  Hip special tests: Portia Brittle (FABER) test: negative Knee special tests: McMurray's test: positive  and Patellafemoral apprehension test: positive   GAIT: Distance walked: 148ft Assistive device utilized:  None Level of assistance: Complete Independence Comments: WFL gait quality  [facility] Adult PT Treatment:                                                DATE: 11/19/2023 Therapeutic Activity: NuStep  8' Activity tolerance Supine bridges against BuTB 1x10 (pt request to stop performing before reaching a progression threshold) Single leg Slant board heel raises 2x15, hold 1s Supine SLR 2x12, hold 4s, LLE   [facility] Adult PT Treatment:                                                DATE: 11/07/2023  Therapeutic Exercise: Rec bike 5' Supine hip flexor stretch w/strap 3x1' Therapeutic Activity: Supine bridge with resistance 2x12, hold 3s, GTB Supine SLR 2x8, hold 5s (regressed to 3s hold) PT education surrounding anatomy, physiology of hip/knee musculature, and HEP update                                                                                                                        [facility] Adult PT Treatment:                                                DATE: 10/29/2023 Self Care: Pt education, detailed below POC discussion    PATIENT EDUCATION:  Education details: Pt received education regarding HEP performance, ADL performance, functional activity tolerance, impairment education, appropriate performance of therapeutic activities. Person educated: Patient Education method: Explanation, Demonstration, Tactile cues, Verbal cues, and Handouts Education comprehension: verbalized understanding and returned demonstration  HOME EXERCISE PROGRAM: Access Code: [email or web address] URL: [email or web address] Date: 11/07/2023 Prepared by: Albesa Huguenin  Exercises - Supine Quadriceps Stretch with Strap on Table  - 1-2 x daily - 2-3 sets - 1 reps - 70m hold - Supine Bridge with Resistance Band  - 1 x daily - 4 x weekly - 2-3 sets - 12 reps - 3s hold - Active Straight Leg Raise with Quad Set  - 1 x daily - 4-7 x weekly - 3 sets - 10 reps - 5s  hold ---------------------------------------------------------------------------------------------  ASSESSMENT:  CLINICAL IMPRESSION: Pt attended physical therapy session for continuation of treatment regarding L knee pain. Today's treatment focused on improvement of  L knee stability, global L hip/knee strength, general activtiy tolerance. Pt continues to be limited during session by requested rest times and motivation to progress between sessions. Pt showed  good tolerance to administered treatment with no adverse effects by the end of session. Skilled intervention was utilized via activity modification for pt tolerance with task completion, functional progression/regression promoting best outcomes inline with current rehab goals, as well as moderate verbal/tactile cuing alongside no physical assistance for safe and appropriate performance of today's activities.  Eval impression (10/29/2023): Pt. attended today's physical therapy session for evaluation of L knee pain. Pt has complaints of bouts of knee buckling with stairs, and unbearable 10/10 pain without medication. Pt has notable deficits with L hip/knee strength, L knee stability, and hip flexor tightness.  Pt would benefit from therapeutic focus on global strengthening, gluteal/quad recruitment during functional motion, and soft tissue extensibility work PRN.  Treatment performed today focused on pt education detailed in obj. Pt demonstrated good understanding of education provided. required minimal verbal/tactile cues and no physical assistance for appropriate performance with today's activities. Pt requires the intervention of skilled outpatient physical therapy to address the aforementioned deficits and progress towards a functional level in line with therapeutic goals.   OBJECTIVE IMPAIRMENTS: decreased mobility, decreased ROM, decreased strength, impaired flexibility, improper body mechanics, and pain.   ACTIVITY LIMITATIONS: carrying,  lifting, stairs, and locomotion level  PARTICIPATION LIMITATIONS: cleaning, laundry, community activity, and occupation  PERSONAL FACTORS: Behavior pattern, Fitness, and Time since onset of injury/illness/exacerbation are also affecting patient's functional outcome.   REHAB POTENTIAL: Fair time since onset  CLINICAL DECISION MAKING: Stable/uncomplicated  EVALUATION COMPLEXITY: Low   GOALS: Goals reviewed with patient? YES  SHORT TERM GOALS: Target date: 11/20/2023 Pt will be independent with administered HEP to demonstrate the competency necessary for long term managemnet of symptoms at home.  Baseline: Goal status: INITIAL   LONG TERM GOALS: Target date: 12/11/2023  Pt. Will achieve a LEFS score of 60/80 as to demonstrate improvement in self-perceived functional ability with daily activities.  Baseline: 48/80 Goal status: INITIAL  2.  Pt will improve Global Hip/knee strength to a 4+/5 to demonstrate improvement in strength for quality of motion and activity performance.  Baseline: see obj chart Goal status: INITIAL  3.  Pt will report pain levels improving during ADLs to be less than or equal to 3/10 as to demonstrate improved tolerance with daily functional activities such as cleaning around the house.  Baseline: 5/10 with meds Goal status: INITIAL  --------------------------------------------------------------------------------------------- PLAN:  PT FREQUENCY: 1-2x/week  PT DURATION: 6 weeks  PLANNED INTERVENTIONS: 97110-Therapeutic exercises, 97530- Therapeutic activity, 97112- Neuromuscular re-education, 97535- Self Care, 56213- Manual therapy, [phone]- Gait training, Patient/Family education, Balance training, Stair training, Taping, Dry Needling, and Joint mobilization  PLAN FOR NEXT SESSION: Continue with therapeutic focus on activity tolerance, quad activation/motility, and global hip/knee strengthening, progressing as tolerated. Review updated HEP tolerance next  session   Albesa Huguenin, PT, DPT 11/19/2023, 2:03 PM

## 2023-11-20 ENCOUNTER — Ambulatory Visit: Admitting: Physical Therapy

## 2023-11-21 ENCOUNTER — Encounter: Admitting: Physical Therapy

## 2023-11-26 ENCOUNTER — Ambulatory Visit: Admitting: Physical Therapy

## 2023-11-26 ENCOUNTER — Encounter: Payer: Self-pay | Admitting: Physical Therapy

## 2023-11-26 DIAGNOSIS — M6281 Muscle weakness (generalized): Secondary | ICD-10-CM

## 2023-11-26 DIAGNOSIS — G8929 Other chronic pain: Secondary | ICD-10-CM

## 2023-11-26 DIAGNOSIS — M25562 Pain in left knee: Secondary | ICD-10-CM | POA: Diagnosis not present

## 2023-11-26 NOTE — Therapy (Addendum)
 OUTPATIENT PHYSICAL THERAPY TREATMENT NOTE   Patient Name: Debra Herrera MRN: [id number] DOB:1981/01/02, 43 y.o., female Today's Date: 11/26/2023   PHYSICAL THERAPY DISCHARGE SUMMARY  Visits from Start of Care: 4  Current functional level related to goals / functional outcomes: see assessment   Remaining deficits: see assessment   Education / Equipment: see assessment   Patient agrees to discharge. Patient goals were unknown. Patient is being discharged due to not returning since the last visit.  Mabel Kiang, PT, DPT 03/02/2024, 10:44 AM    END OF SESSION:  PT End of Session - 11/26/23 1447     Visit Number 4    Number of Visits 12    Date for PT Re-Evaluation 12/12/23    PT Start Time 1446    PT Stop Time 1526    PT Time Calculation (min) 40 min    Activity Tolerance Patient tolerated treatment well    Behavior During Therapy WFL for tasks assessed/performed               Past Medical History:  Diagnosis Date   Anxiety    Depression    Headache(784.0)    Human papilloma virus    Hypercholesteremia    OCD (obsessive compulsive disorder)    TMJ arthralgia    Urinary tract infection    Past Surgical History:  Procedure Laterality Date   ADENOIDECTOMY     TONSILLECTOMY AND ADENOIDECTOMY     Patient Active Problem List   Diagnosis Date Noted   Grief 11/16/2020   Candidiasis of skin 08/16/2020   Cyst of ovary 08/16/2020   Lesion of vulva 08/16/2020   Borderline personality disorder (HCC) 09/18/2018   Major depressive disorder, recurrent episode (HCC) 09/18/2018   Reaction to severe stress, unspecified 09/18/2018   Generalized anxiety disorder 09/18/2018   Amenorrhea, secondary 02/07/2011   DEPRESSIVE DISORDER NOT ELSEWHERE CLASSIFIED 10/12/2008    PCP: no pcp per pt chart  REFERRING PROVIDER: Sherida Adine BROCKS, MD  REFERRING DIAG: left knee pain   Rationale for Evaluation and Treatment: Rehabilitation  THERAPY DIAG:  Muscle  weakness (generalized)  Chronic pain of left knee  PERTINENT HISTORY: Pre-diabetic  WEIGHT BEARING RESTRICTIONS: No  FALLS:  Has patient fallen in last 6 months? Yes. Number of falls 1, falls occasionally when knee buckles  LIVING ENVIRONMENT: Lives with: lives with their family Lives in: House/apartment Stairs: Yes, difficulties with ambulation Has following equipment at home: None  OCCUPATION: [employer],    PRECAUTIONS: None ---------------------------------------------------------------------------------------------  SUBJECTIVE:   SUBJECTIVE STATEMENT: Pt attended today's session with reports of continued symptoms of the knee buckling at times Pt stated that they have maintained poor compliance with current HEP, says that she never has time to do the exercises at home, I'm 24/7 busy.   Eval statement 10/30/2023:  L knee pain onset past 3 years ago, pain currently 4/10. Pain is worse without medication. Sharp stabbing pain underneath the kneecap.  Last saw an ortho doctor yesterday, was told she the cartilage in her knee is like a road with potholes.  RED FLAGS: None   PLOF: Independent  PATIENT GOALS: strengthen the leg muscles  NEXT MD VISIT: may 21st ---------------------------------------------------------------------------------------------  OBJECTIVE:  Note: Objective measures were completed at Evaluation unless otherwise noted.  DIAGNOSTIC FINDINGS: no recent relevant imaging  PATIENT SURVEYS:  LEFS 42/80  COGNITION: Overall cognitive status: Within functional limits for tasks assessed     SENSATION: Not tested  EDEMA:  None noted, reports occasional  swelling  MUSCLE LENGTH: Hamstrings: Right 0  deg; Left 0 deg Pain during L hip ext, could not finish test  POSTURE: rounded shoulders and forward head  PALPATION: Tenderness around L patella   LOWER EXTREMITY ROM:  Passive ROM Right eval Left eval  Hip flexion [facility]!  Hip  extension limited limited  Hip abduction    Hip adduction    Hip internal rotation [facility]  Hip external rotation [facility]  Knee flexion [facility]!  Knee extension [facility]  Ankle dorsiflexion    Ankle plantarflexion    Ankle inversion    Ankle eversion     (Blank rows = not tested)  ! Indicates pain with testing  LOWER EXTREMITY MMT:  MMT Right eval Left eval  Hip flexion 4 4  Hip extension 4 4  Hip abduction 4 4  Hip adduction    Hip internal rotation    Hip external rotation    Knee flexion 4 4  Knee extension 4 4  Ankle dorsiflexion    Ankle plantarflexion    Ankle inversion    Ankle eversion     (Blank rows = not tested)  ! Indicates pain with testing  LOWER EXTREMITY SPECIAL TESTS:  Hip special tests: Belvie (FABER) test: negative Knee special tests: McMurray's test: positive  and Patellafemoral apprehension test: positive   GAIT: Distance walked: 158ft Assistive device utilized: None Level of assistance: Complete Independence Comments: WFL gait quality  [facility] Adult PT Treatment:                                                DATE: 11/26/2023 Therapeutic Activity: NuStep 8' for activity tolerance Long sitting SLR with abd/add over cone 2x5, hold 2s B slant board heel raise 2x15, hold 2s Bridge against GTB 2x12, hold 3s   [facility] Adult PT Treatment:                                                DATE: 11/19/2023 Therapeutic Activity: NuStep  8' Activity tolerance Supine bridges against BuTB 1x10 (pt request to stop performing before reaching a progression threshold) Single leg Slant board heel raises 2x15, hold 1s Supine SLR 2x12, hold 4s, LLE    PATIENT EDUCATION:  Education details: Pt received education regarding HEP performance, ADL performance, functional activity tolerance, impairment education, appropriate performance of therapeutic activities. Person educated: Patient Education method: Explanation, Demonstration, Tactile cues, Verbal cues, and  Handouts Education comprehension: verbalized understanding and returned demonstration  HOME EXERCISE PROGRAM: Access Code: [email or web address] URL: [email or web address] Date: 11/07/2023 Prepared by: Mabel Kiang  Exercises - Supine Quadriceps Stretch with Strap on Table  - 1-2 x daily - 2-3 sets - 1 reps - 40m hold - Supine Bridge with Resistance Band  - 1 x daily - 4 x weekly - 2-3 sets - 12 reps - 3s hold - Active Straight Leg Raise with Quad Set  - 1 x daily - 4-7 x weekly - 3 sets - 10 reps - 5s hold ---------------------------------------------------------------------------------------------  ASSESSMENT:  CLINICAL IMPRESSION: Pt attended physical therapy session for continuation of treatment regarding L knee pain. Today's treatment focused on improvement of  L knee stability, global L hip/knee strength, general activtiy tolerance.  Pt continues to be limited during session by requested rest times and motivation to progress between sessions. Pt showed  good tolerance to administered treatment with no adverse effects by the end of session. Skilled intervention was utilized via activity modification for pt tolerance with task completion, functional progression/regression promoting best outcomes inline with current rehab goals, as well as moderate verbal/tactile cuing alongside no physical assistance for safe and appropriate performance of today's activities. Pt has maintained poor compliance with HEP for a month and has demonstrated minimal progress with initial complaints d/t consistency with HEP. Consider d/c d/t lack of participation at next visit.   Eval impression (10/29/2023): Pt. attended today's physical therapy session for evaluation of L knee pain. Pt has complaints of bouts of knee buckling with stairs, and unbearable 10/10 pain without medication. Pt has notable deficits with L hip/knee strength, L knee stability, and hip flexor tightness.  Pt would benefit from therapeutic focus  on global strengthening, gluteal/quad recruitment during functional motion, and soft tissue extensibility work PRN.  Treatment performed today focused on pt education detailed in obj. Pt demonstrated good understanding of education provided. required minimal verbal/tactile cues and no physical assistance for appropriate performance with today's activities. Pt requires the intervention of skilled outpatient physical therapy to address the aforementioned deficits and progress towards a functional level in line with therapeutic goals.   OBJECTIVE IMPAIRMENTS: decreased mobility, decreased ROM, decreased strength, impaired flexibility, improper body mechanics, and pain.   ACTIVITY LIMITATIONS: carrying, lifting, stairs, and locomotion level  PARTICIPATION LIMITATIONS: cleaning, laundry, community activity, and occupation  PERSONAL FACTORS: Behavior pattern, Fitness, and Time since onset of injury/illness/exacerbation are also affecting patient's functional outcome.   REHAB POTENTIAL: Fair time since onset  CLINICAL DECISION MAKING: Stable/uncomplicated  EVALUATION COMPLEXITY: Low   GOALS: Goals reviewed with patient? YES  SHORT TERM GOALS: Target date: 11/20/2023 Pt will be independent with administered HEP to demonstrate the competency necessary for long term managemnet of symptoms at home.  Baseline: Goal status: INITIAL   LONG TERM GOALS: Target date: 12/11/2023  Pt. Will achieve a LEFS score of 60/80 as to demonstrate improvement in self-perceived functional ability with daily activities.  Baseline: 48/80 Goal status: INITIAL  2.  Pt will improve Global Hip/knee strength to a 4+/5 to demonstrate improvement in strength for quality of motion and activity performance.  Baseline: see obj chart Goal status: INITIAL  3.  Pt will report pain levels improving during ADLs to be less than or equal to 3/10 as to demonstrate improved tolerance with daily functional activities such as  cleaning around the house.  Baseline: 5/10 with meds Goal status: INITIAL  --------------------------------------------------------------------------------------------- PLAN:  PT FREQUENCY: 1-2x/week  PT DURATION: 6 weeks  PLANNED INTERVENTIONS: 97110-Therapeutic exercises, 97530- Therapeutic activity, 97112- Neuromuscular re-education, 97535- Self Care, [id number]- Manual therapy, [phone]- Gait training, Patient/Family education, Balance training, Stair training, Taping, Dry Needling, and Joint mobilization  PLAN FOR NEXT SESSION: Continue with therapeutic focus on activity tolerance, quad activation/motility, and global hip/knee strengthening, progressing as tolerated.   Mabel Kiang, PT, DPT 11/26/2023, 3:32 PM

## 2023-11-28 ENCOUNTER — Ambulatory Visit: Admitting: Physical Therapy

## 2023-12-02 ENCOUNTER — Encounter (HOSPITAL_COMMUNITY): Payer: Self-pay

## 2023-12-02 ENCOUNTER — Other Ambulatory Visit: Payer: Self-pay

## 2023-12-02 ENCOUNTER — Emergency Department (HOSPITAL_COMMUNITY)
Admission: EM | Admit: 2023-12-02 | Discharge: 2023-12-03 | Discharge status: Against Medical Advice | Attending: Emergency Medicine | Admitting: Emergency Medicine

## 2023-12-02 DIAGNOSIS — Z5321 Procedure and treatment not carried out due to patient leaving prior to being seen by health care provider: Secondary | ICD-10-CM | POA: Insufficient documentation

## 2023-12-02 DIAGNOSIS — W228XXA Striking against or struck by other objects, initial encounter: Secondary | ICD-10-CM | POA: Diagnosis not present

## 2023-12-02 DIAGNOSIS — S0990XA Unspecified injury of head, initial encounter: Secondary | ICD-10-CM | POA: Insufficient documentation

## 2023-12-02 NOTE — ED Triage Notes (Signed)
 Pt to ED from her place of employment by EMS. Pt was getting into her car and hit the top of her head. Denies any LOC, no bruising, redness or deformity noted. VSS, NADN. Pt endorses a headache and photosensitivity.

## 2023-12-03 NOTE — ED Notes (Signed)
 Pt stated she was leaving, informed pt we recommend her wait til she see a provider. Pt stated she doesn't feel like waiting.

## 2024-01-28 ENCOUNTER — Encounter (HOSPITAL_COMMUNITY): Admitting: Psychiatry

## 2024-02-18 ENCOUNTER — Encounter (HOSPITAL_COMMUNITY): Admitting: Psychiatry

## 2024-02-20 ENCOUNTER — Telehealth (INDEPENDENT_AMBULATORY_CARE_PROVIDER_SITE_OTHER): Admitting: Family

## 2024-02-20 ENCOUNTER — Encounter (HOSPITAL_COMMUNITY): Payer: Self-pay | Admitting: Family

## 2024-02-20 DIAGNOSIS — F3341 Major depressive disorder, recurrent, in partial remission: Secondary | ICD-10-CM

## 2024-02-20 DIAGNOSIS — F411 Generalized anxiety disorder: Secondary | ICD-10-CM

## 2024-02-20 MED ORDER — ESCITALOPRAM OXALATE 20 MG PO TABS: 20.0000 mg | ORAL_TABLET | Freq: Every day | ORAL | 3 refills | Status: AC

## 2024-02-20 MED ORDER — HYDROXYZINE HCL 10 MG PO TABS: 10.0000 mg | ORAL_TABLET | Freq: Three times a day (TID) | ORAL | 3 refills | Status: AC | PRN

## 2024-02-20 NOTE — Progress Notes (Signed)
 [facility] MD/PA/NP OP Progress Note Virtual Visit via Video Note  I connected with Debra Herrera on 02/20/24 at  4:00 PM EDT by a video enabled telemedicine application and verified that I am speaking with the correct person using two identifiers.  Location: Patient: Home Provider: Clinic   I discussed the limitations of evaluation and management by telemedicine and the availability of in person appointments. The patient expressed understanding and agreed to proceed.  I provided 30 minutes of non-face-to-face time during this encounter.   02/20/2024 4:37 PM Debra Herrera  MRN:  [id number]  Chief Complaint:  Things are going okay    HPI: 43 year old female seen for follow up psychiatric evaluation. She has a psychiatric history of anxiety, OCD, and depression. She is currently prescribed Lexapro  20 mg daily, melatonin 5-10 mg as needed (OTC), and hydroxyzine  10 mg three times daily for anxiety.  Today she noted that medications are effective in managing her psychiatric conditions.  Patient was seen and assessed today. She presented well-groomed, pleasant, cooperative, engaged in conversation, and maintained appropriate eye contact. She reports that things are going well overall and that her children are doing well. She describes having had a very busy summer, spending time at the water park with her kids and keeping them active. She denies depression symptoms but endorses ongoing anxiety related to stressors including her dogs, her children, and her mother, who was recently diagnosed with breast cancer. She reports frequent worry about her mother's health but notes that the biopsy report returned with a favorable prognosis.  At her last visit she reported that her anxiety and depression remained well managed. Today, a GAD-7 was administered and she scored a 4, compared to 6 at her previous visit. A PHQ-9 was also completed, with a score of 0 compared to 3 previously. She  reports adequate sleep and appetite. She denies substance use, suicidal ideation, homicidal ideation, mania, paranoia, or auditory/visual hallucinations. Visit Diagnosis:    ICD-10-CM   1. Recurrent major depressive disorder, in partial remission (HCC)  F33.41     2. Generalized anxiety disorder  F41.1          Past Psychiatric History: Anxiety, OCD, and depression  Past Medical History:  Past Medical History:  Diagnosis Date   Anxiety    Depression    Headache(784.0)    Human papilloma virus    Hypercholesteremia    OCD (obsessive compulsive disorder)    TMJ arthralgia    Urinary tract infection     Past Surgical History:  Procedure Laterality Date   ADENOIDECTOMY     TONSILLECTOMY AND ADENOIDECTOMY      Family Psychiatric History: Brother Bipolar disorder, Mother OCD  Family History:  Family History  Problem Relation Age of Onset   Diabetes Father    Hypertension Father    Hypertension Mother    Other Neg Hx     Social History:  Social History   Socioeconomic History   Marital status: Widowed    Spouse name: Not on file   Number of children: Not on file   Years of education: Not on file   Highest education level: Not on file  Occupational History   Not on file  Tobacco Use   Smoking status: Never   Smokeless tobacco: Never  Vaping Use   Vaping status: Never Used  Substance and Sexual Activity   Alcohol use: No   Drug use: No   Sexual activity: Yes    Partners: Male  Other  Topics Concern   Not on file  Social History Narrative   ** Merged History Encounter **       Social Drivers of Corporate investment banker Strain: Not on file  Food Insecurity: Not on file  Transportation Needs: Not on file  Physical Activity: Not on file  Stress: Not on file  Social Connections: Not on file    Allergies:  Allergies  Allergen Reactions   Benadryl  [Diphenhydramine  Hcl] Rash   Latex Rash   Sulfa Antibiotics Rash    Metabolic Disorder Labs: No  results found for: HGBA1C, MPG No results found for: PROLACTIN No results found for: CHOL, TRIG, HDL, CHOLHDL, VLDL, LDLCALC No results found for: TSH  Therapeutic Level Labs: No results found for: LITHIUM No results found for: VALPROATE No results found for: CBMZ  Current Medications: Current Outpatient Medications  Medication Sig Dispense Refill   atorvastatin (LIPITOR) 80 MG tablet Take 80 mg by mouth daily.     azithromycin  (ZITHROMAX ) 250 MG tablet Take 1 tablet (250 mg total) by mouth daily. Take first 2 tablets together, then 1 every day until finished. (Patient not taking: Reported on 10/29/2022) 6 tablet 0   benzonatate  (TESSALON ) 100 MG capsule Take 1 capsule (100 mg total) by mouth every 8 (eight) hours as needed for cough. (Patient not taking: Reported on 10/29/2022) 21 capsule 0   escitalopram  (LEXAPRO ) 20 MG tablet Take 1 tablet (20 mg total) by mouth daily. 30 tablet 3   fexofenadine  (ALLEGRA  ALLERGY) 180 MG tablet Take 1 tablet (180 mg total) by mouth daily for 15 days. (Patient not taking: Reported on 10/29/2022) 15 tablet 0   hydrOXYzine  (ATARAX ) 10 MG tablet Take 1 tablet (10 mg total) by mouth 3 (three) times daily as needed. 90 tablet 3   meloxicam  (MOBIC ) 15 MG tablet Take 1 tablet (15 mg total) by mouth daily. 30 tablet 0   methocarbamol  (ROBAXIN ) 500 MG tablet Take 1 tablet (500 mg total) by mouth every 8 (eight) hours as needed for muscle spasms. 15 tablet 0   methylPREDNISolone  (MEDROL  DOSEPAK) 4 MG TBPK tablet 6 day dose pack - take as directed 21 tablet 0   Multiple Vitamin (MULTIVITAMIN) tablet Take 1 tablet by mouth daily with breakfast.     ondansetron  (ZOFRAN ) 4 MG tablet Take 1 tablet (4 mg total) by mouth every 8 (eight) hours as needed for nausea or vomiting. (Patient not taking: Reported on 07/24/2023) 12 tablet 0   TYLENOL  500 MG tablet Take 500-1,000 mg by mouth every 6 (six) hours as needed for mild pain (or headaches).     No  current facility-administered medications for this visit.     Musculoskeletal: Strength & Muscle Tone: within normal limits and telehealth visit Gait & Station: normal, telehealth visit Patient leans: N/A  Psychiatric Specialty Exam: Review of Systems  All other systems reviewed and are negative.   There were no vitals taken for this visit.There is no height or weight on file to calculate BMI.  General Appearance: Well Groomed  Eye Contact:  Good  Speech:  Clear and Coherent and Normal Rate  Volume:  Normal  Mood:  Euthymic,  Affect:  Appropriate and Congruent  Thought Process:  Coherent, Goal Directed and Linear  Orientation:  Full (Time, Place, and Person)  Thought Content: WDL and Logical   Suicidal Thoughts:  No  Homicidal Thoughts:  No  Memory:  Immediate;   Good Recent;   Good Remote;   Good  Judgement:  Good  Insight:  Good  Psychomotor Activity:  Normal  Concentration:  Concentration: Good and Attention Span: Good  Recall:  Good  Fund of Knowledge: Good  Language: Good  Akathisia:  No  Handed:  Right  AIMS (if indicated): Not done  Assets:  Communication Skills Desire for Improvement Financial Resources/Insurance Housing Social Support  ADL's:  Intact  Cognition: WNL  Sleep:  Good   Screenings: GAD-7    Flowsheet Row Video Visit from 11/18/2023 in [facility] Video Visit from 09/18/2023 in [facility] Video Visit from 06/07/2023 in [facility] Video Visit from 03/27/2023 in [facility] Clinical Support from 12/24/2022 in [facility]  Total GAD-7 Score 4 6 10 12 7    PHQ2-9    Flowsheet Row Video Visit from 11/18/2023 in [facility] Video Visit from 09/18/2023 in [facility] Video Visit from 06/07/2023 in [facility] Video Visit from  03/27/2023 in [facility] Clinical Support from 12/24/2022 in [facility]  PHQ-2 Total Score 0 1 1 2  0  PHQ-9 Total Score 3 11 10 6 7    Flowsheet Row ED from 12/02/2023 in [facility] Emergency Department at [facility] ED from 08/23/2023 in [facility] ED from 05/08/2023 in [facility] Emergency Department at [facility]  C-SSRS RISK CATEGORY No Risk No Risk No Risk     Assessment and Plan: Patient is a middle-aged female with a history of anxiety and depression that remain stable on her current regimen. She demonstrates mild situational anxiety related to family and caregiving stressors but no acute psychiatric symptoms. Mental status and screening scores reflect overall improvement. No safety concerns at this time.  1. Generalized anxiety disorder  Continue- hydrOXYzine  (ATARAX ) 10 MG tablet; Take 1 tablet (10 mg total) by mouth 3 (three) times daily as needed.  Dispense: 90 tablet; Refill: 3  2. Recurrent major depressive disorder, in partial remission (HCC)  Continue- escitalopram  (LEXAPRO ) 20 MG tablet; Take 1 tablet (20 mg total) by mouth daily.  Dispense: 30 tablet; Refill: 3  Follow up in 2 months.    Majel GORMAN Ramp, FNP 02/20/2024, 4:37 PM

## 2024-05-03 ENCOUNTER — Encounter (HOSPITAL_COMMUNITY): Payer: Self-pay

## 2024-05-03 ENCOUNTER — Ambulatory Visit (HOSPITAL_COMMUNITY)
Admission: EM | Admit: 2024-05-03 | Discharge: 2024-05-03 | Disposition: A | Discharge status: Home / Self Care | Attending: Physician Assistant | Admitting: Physician Assistant

## 2024-05-03 DIAGNOSIS — L03032 Cellulitis of left toe: Secondary | ICD-10-CM | POA: Diagnosis not present

## 2024-05-03 MED ORDER — MUPIROCIN 2 % EX OINT: 1.0000 | TOPICAL_OINTMENT | Freq: Two times a day (BID) | CUTANEOUS | 0 refills | Status: DC

## 2024-05-03 MED ORDER — AMOXICILLIN-POT CLAVULANATE 875-125 MG PO TABS: 1.0000 | ORAL_TABLET | Freq: Two times a day (BID) | ORAL | 0 refills | Status: DC

## 2024-05-03 NOTE — ED Provider Notes (Signed)
 [facility]    CSN: [id number] Arrival date & time: 05/03/24  1219      History   Chief Complaint Chief Complaint  Patient presents with   Nail Problem    Left great toe    HPI Debra Herrera is a 43 y.o. female.   Patient presents today with a 1 week history of swelling and tenderness to palpation of her left great toe lateral nail fold.  She reports that she was concerned that there was an ingrown tail and so she started clipping of this.  She was able to remove a portion of the nail but then developed pain and swelling of the nail fold.  She reports that pain is rated 4 on a 0-10 pain scale, described as aching, worse with wearing closed toed shoes, no alleviating factors identified.  She has had part of her nail removed before but denies additional surgery involving her toes or nails.  She has not followed by podiatry.  She denies history of diabetes.  Denies any recent antibiotics.  She is confident that she is not pregnant.  She denies any fever, nausea, vomiting, numbness/tingling in her toe.    Past Medical History:  Diagnosis Date   Anxiety    Depression    Headache(784.0)    Human papilloma virus    Hypercholesteremia    OCD (obsessive compulsive disorder)    TMJ arthralgia    Urinary tract infection     Patient Active Problem List   Diagnosis Date Noted   Grief 11/16/2020   Candidiasis of skin 08/16/2020   Cyst of ovary 08/16/2020   Lesion of vulva 08/16/2020   Borderline personality disorder (HCC) 09/18/2018   Major depressive disorder, recurrent episode 09/18/2018   Reaction to severe stress, unspecified 09/18/2018   Generalized anxiety disorder 09/18/2018   Amenorrhea, secondary 02/07/2011   DEPRESSIVE DISORDER NOT ELSEWHERE CLASSIFIED 10/12/2008    Past Surgical History:  Procedure Laterality Date   ADENOIDECTOMY     TONSILLECTOMY AND ADENOIDECTOMY      OB History     Gravida  2   Para  2   Term  2   Preterm  0    AB  0   Living  2      SAB  0   IAB  0   Ectopic  0   Multiple      Live Births  2            Home Medications    Prior to Admission medications   Medication Sig Start Date End Date Taking? Authorizing Provider  amoxicillin -clavulanate (AUGMENTIN) 875-125 MG tablet Take 1 tablet by mouth every 12 (twelve) hours. 05/03/24  Yes Jazlen Ogarro K, PA-C  atorvastatin (LIPITOR) 80 MG tablet Take 80 mg by mouth daily.   Yes [provider]  escitalopram  (LEXAPRO ) 20 MG tablet Take 1 tablet (20 mg total) by mouth daily. 02/20/24  Yes Starkes-Perry, Majel RAMAN, FNP  fenofibrate (TRICOR) 145 MG tablet Take 145 mg by mouth daily.   Yes [provider]  hydrOXYzine  (ATARAX ) 10 MG tablet Take 1 tablet (10 mg total) by mouth 3 (three) times daily as needed. 02/20/24  Yes Starkes-Perry, Majel RAMAN, FNP  meloxicam  (MOBIC ) 15 MG tablet Take 1 tablet (15 mg total) by mouth daily. 01/15/23  Yes Horton, Charmaine FALCON, MD  methocarbamol  (ROBAXIN ) 500 MG tablet Take 1 tablet (500 mg total) by mouth every 8 (eight) hours as needed for muscle spasms. 05/09/23  Yes Griselda Norris, MD  Multiple Vitamin (MULTIVITAMIN) tablet Take 1 tablet by mouth daily with breakfast.   Yes [provider]  mupirocin  ointment (BACTROBAN ) 2 % Apply 1 Application topically 2 (two) times daily. 05/03/24  Yes Shadae Reino K, PA-C  TYLENOL  500 MG tablet Take 500-1,000 mg by mouth every 6 (six) hours as needed for mild pain (or headaches).   Yes [provider]  Vitamin D, Ergocalciferol, (DRISDOL) 1.25 MG (50000 UNIT) CAPS capsule Take 50,000 Units by mouth once a week.   Yes [provider]    Family History Family History  Problem Relation Age of Onset   Diabetes Father    Hypertension Father    Hypertension Mother    Other Neg Hx     Social History Social History   Tobacco Use   Smoking status: Never   Smokeless tobacco: Never  Vaping Use   Vaping status: Never Used   Substance Use Topics   Alcohol use: No   Drug use: No     Allergies   Benadryl  [diphenhydramine  hcl], Latex, and Sulfa antibiotics   Review of Systems Review of Systems  Constitutional:  Positive for activity change. Negative for appetite change, fatigue and fever.  Gastrointestinal:  Negative for diarrhea, nausea and vomiting.  Musculoskeletal:  Positive for arthralgias. Negative for gait problem, joint swelling and myalgias.  Skin:  Positive for color change and wound.  Neurological:  Negative for weakness and numbness.     Physical Exam Triage Vital Signs ED Triage Vitals  Encounter Vitals Group     BP 05/03/24 1457 120/67     Girls Systolic BP Percentile --      Girls Diastolic BP Percentile --      Boys Systolic BP Percentile --      Boys Diastolic BP Percentile --      Pulse Rate 05/03/24 1457 82     Resp 05/03/24 1457 18     Temp 05/03/24 1457 98.3 F (36.8 C)     Temp Source 05/03/24 1457 Oral     SpO2 05/03/24 1457 96 %     Weight --      Height --      Head Circumference --      Peak Flow --      Pain Score 05/03/24 1455 4     Pain Loc --      Pain Education --      Exclude from Growth Chart --    No data found.  Updated Vital Signs BP 120/67 (BP Location: Left Arm)   Pulse 82   Temp 98.3 F (36.8 C) (Oral)   Resp 18   LMP 04/05/2024 (Exact Date)   SpO2 96%   Visual Acuity Right Eye Distance:   Left Eye Distance:   Bilateral Distance:    Right Eye Near:   Left Eye Near:    Bilateral Near:     Physical Exam Vitals reviewed.  Constitutional:      General: She is awake. She is not in acute distress.    Appearance: Normal appearance. She is well-developed. She is not ill-appearing.     Comments: Very pleasant female appears stated age in no acute distress sitting comfortably in exam room  HENT:     Head: Normocephalic and atraumatic.  Cardiovascular:     Rate and Rhythm: Normal rate and regular rhythm.     Heart sounds: Normal heart  sounds, S1 normal and S2 normal. No murmur heard.  Comments: Capillary refill within 2 seconds left toes Pulmonary:     Effort: Pulmonary effort is normal.     Breath sounds: Normal breath sounds. No wheezing, rhonchi or rales.     Comments: Clear to auscultation bilaterally Feet:     Left foot:     Protective Sensation: 10 sites tested.  10 sites sensed.     Skin integrity: Erythema and warmth present. No skin breakdown.     Toenail Condition: Left toenails are ingrown.     Comments: Erythema and tenderness palpation over lateral nail fold of left great toe with some purulent drainage noted in nail fold.  Foot is neurovascular intact. Psychiatric:        Behavior: Behavior is cooperative.      UC Treatments / Results  Labs (all labs ordered are listed, but only abnormal results are displayed) Labs Reviewed - No data to display  EKG   Radiology No results found.  Procedures Procedures (including critical care time)  Medications Ordered in UC Medications - No data to display  Initial Impression / Assessment and Plan / UC Course  I have reviewed the triage vital signs and the nursing notes.  Pertinent labs & imaging results that were available during my care of the patient were reviewed by me and considered in my medical decision making (see chart for details).     Patient is well-appearing, afebrile, nontoxic, nontachycardic.  Foot is neurovascularly intact.  Plain film was deferred as she denies any recent trauma and I have a low suspicion for osteomyelitis as she does not have any risk factors or systemic symptoms.  Will treat for paronychia with Augmentin twice daily for 7 days.  No indication for dose adjustment based on metabolic panel from 08/22/2023 with a creatinine of 1.19 and calculated creatinine clearance of 94 mL/min.  She was encouraged to continue soaking the foot in Epsom salt regularly and apply Bactroban  ointment to the nail fold with dressing changes twice  a day.  I did recommend that she follow-up with a podiatrist and was given the contact information for local provider with instruction to call to schedule an appointment.  We discussed that if anything worsens or changes and she has increasing pain, additional swelling, fever, nausea, vomiting, numbness or tingling in the toe she should be seen immediately.  Strict return precautions given.  Patient expressed understanding and agreement with treatment plan.  Final Clinical Impressions(s) / UC Diagnoses   Final diagnoses:  Paronychia of great toe of left foot     Discharge Instructions      You have an infection in your nailbed.  Continue warm Epsom salt soaks.  Apply Bactroban  ointment twice daily and keep this covered with a Band-Aid.  Take Augmentin twice a day.  Follow-up with podiatry; call to schedule an appointment.  If anything worsens and you have fever, increasing pain, abnormal drainage, numbness or tingling in your foot you need to be seen immediately.     ED Prescriptions     Medication Sig Dispense Auth. Provider   amoxicillin -clavulanate (AUGMENTIN) 875-125 MG tablet Take 1 tablet by mouth every 12 (twelve) hours. 14 tablet Lenox Ladouceur K, PA-C   mupirocin  ointment (BACTROBAN ) 2 % Apply 1 Application topically 2 (two) times daily. 22 g Marguerita Stapp K, PA-C      PDMP not reviewed this encounter.   Sherrell Rocky POUR, PA-C 05/03/24 [id number]

## 2024-05-03 NOTE — Discharge Instructions (Signed)
 You have an infection in your nailbed.  Continue warm Epsom salt soaks.  Apply Bactroban  ointment twice daily and keep this covered with a Band-Aid.  Take Augmentin twice a day.  Follow-up with podiatry; call to schedule an appointment.  If anything worsens and you have fever, increasing pain, abnormal drainage, numbness or tingling in your foot you need to be seen immediately.

## 2024-05-03 NOTE — ED Triage Notes (Signed)
 Ingrown toe nail of the great left toe onset less than a week ago. States the toe is draining and swollen. States was able to remove some of the extra nail but the toe continues to be swollen and painful.

## 2024-05-07 ENCOUNTER — Ambulatory Visit: Admitting: Podiatry

## 2024-05-11 ENCOUNTER — Encounter: Payer: Self-pay | Admitting: Podiatry

## 2024-05-11 ENCOUNTER — Ambulatory Visit (INDEPENDENT_AMBULATORY_CARE_PROVIDER_SITE_OTHER): Admitting: Podiatry

## 2024-05-11 VITALS — Ht 64.0 in | Wt 213.0 lb

## 2024-05-11 DIAGNOSIS — L600 Ingrowing nail: Secondary | ICD-10-CM

## 2024-05-11 NOTE — Progress Notes (Signed)
   Chief Complaint  Patient presents with   Ingrown Toenail    Pt is here due to left great toenail due to ingrown, that has been there a few weeks she wants to have it removed.    Subjective: Patient presents today for evaluation of pain to the lateral border left great toe. Patient is concerned for possible ingrown nail.  It is very sensitive to touch.  Patient presents today for further treatment and evaluation.  Past Medical History:  Diagnosis Date   Anxiety    Depression    Headache(784.0)    Human papilloma virus    Hypercholesteremia    OCD (obsessive compulsive disorder)    TMJ arthralgia    Urinary tract infection     Past Surgical History:  Procedure Laterality Date   ADENOIDECTOMY     TONSILLECTOMY AND ADENOIDECTOMY      Allergies  Allergen Reactions   Benadryl  [Diphenhydramine  Hcl] Rash   Latex Rash   Sulfa Antibiotics Rash    Objective:  General: Well developed, nourished, in no acute distress, alert and oriented x3   Dermatology: Skin is warm, dry and supple bilateral.  Lateral border left great toe is tender with evidence of an ingrowing nail. Pain on palpation noted to the border of the nail fold. The remaining nails appear unremarkable at this time.   Vascular: DP and PT pulses palpable.  No clinical evidence of vascular compromise  Neruologic: Grossly intact via light touch bilateral.  Musculoskeletal: No pedal deformity noted  Assesement: #1 Paronychia with ingrowing nail lateral border left great toe  Plan of Care:  -Patient evaluated.  -Discussed treatment alternatives and plan of care. Explained nail avulsion procedure and post procedure course to patient. -Patient opted for permanent partial nail avulsion of the ingrown portion of the nail.  -Prior to procedure, local anesthesia infiltration utilized using 3 ml of a 50:50 mixture of 2% plain lidocaine  and 0.5% plain marcaine  in a normal hallux block fashion and a betadine prep performed.   -Partial permanent nail avulsion with chemical matrixectomy performed using 3x30sec applications of phenol followed by alcohol flush.  -Light dressing applied.  Post care instructions provided -Return to clinic PRN  Thresa EMERSON Sar, DPM [facility]  Dr. Thresa EMERSON Sar, DPM    [address] [place], [place] [address]                Office [phone]  Fax [phone]

## 2024-06-09 ENCOUNTER — Ambulatory Visit (INDEPENDENT_AMBULATORY_CARE_PROVIDER_SITE_OTHER)

## 2024-06-09 DIAGNOSIS — L600 Ingrowing nail: Secondary | ICD-10-CM | POA: Diagnosis not present

## 2024-06-14 NOTE — Progress Notes (Signed)
 "  Subjective:  Patient ID: Debra Herrera, female    DOB: May 31, 1981,  MRN: [id number]  Chief Complaint  Patient presents with   Ingrown Toenail    Left foot hallux nail , looks ok at this time    43 y.o. female presents with the above complaint. She is here for recheck of left hallux partial nail avulsion site (05/11/24, Dr. Janit). She states she has had some occasional drainage and would like it evaluated. Left lateral border of hallux.   Review of Systems: Negative except as noted in the HPI. Denies N/V/F/Ch.  Past Medical History:  Diagnosis Date   Anxiety    Depression    Headache(784.0)    Human papilloma virus    Hypercholesteremia    OCD (obsessive compulsive disorder)    TMJ arthralgia    Urinary tract infection    Current Medications[1]  Tobacco Use History[2]  Allergies[3] Objective:  There were no vitals filed for this visit. There is no height or weight on file to calculate BMI. Constitutional Well developed. Well nourished. Oriented to person, place, and time.  Vascular Dorsalis pedis pulses palpable bilaterally. Posterior tibial pulses palpable bilaterally. Capillary refill normal to all digits.  No cyanosis or clubbing noted. Pedal hair growth normal.  Neurologic Normal speech. Epicritic sensation to light touch grossly present bilaterally. Negative tinel sign at tarsal tunnel bilaterally.   Dermatologic Skin texture and turgor are within normal limits.  Left hallux lateral border avulsion site has small eschar formation with ingrowth of epithelial tissue around the eschar. No erythema, edema, or signs of infection are present. Minimal serous drainage. Mild pain to palpation at lateral border.  No skin lesions.  Musculoskeletal: 5/5 muscle strength, no contributing deformity.      Assessment:   1. Ingrown toenail of left foot    Plan:  - Patient was evaluated and treated and all questions answered.  Nail check, left lateral border of  hallux - Patient 4 weeks s/p left lateral border of hallux PNA. Site is healing well with ingrowth of epithelial tissue surrounding one small stable eschar. No signs of infection are present - Discussed  treatment options including repeat partial nail avulsion, whole nail avulsion, allowing the area to air dry and observing. She would like to proceed with conservative care.  - Discussed allowing the area to air dry. Does not require antibiotic ointment and bandaid.  - RTC as needed   Prentice Ovens, DPM AACFAS Fellowship Trained Podiatric Surgeon [facility]      [1]  Current Outpatient Medications:    amoxicillin -clavulanate (AUGMENTIN ) 875-125 MG tablet, Take 1 tablet by mouth every 12 (twelve) hours., Disp: 14 tablet, Rfl: 0   atorvastatin (LIPITOR) 80 MG tablet, Take 80 mg by mouth daily., Disp: , Rfl:    escitalopram  (LEXAPRO ) 20 MG tablet, Take 1 tablet (20 mg total) by mouth daily., Disp: 30 tablet, Rfl: 3   fenofibrate (TRICOR) 145 MG tablet, Take 145 mg by mouth daily., Disp: , Rfl:    hydrOXYzine  (ATARAX ) 10 MG tablet, Take 1 tablet (10 mg total) by mouth 3 (three) times daily as needed., Disp: 90 tablet, Rfl: 3   meloxicam  (MOBIC ) 15 MG tablet, Take 1 tablet (15 mg total) by mouth daily., Disp: 30 tablet, Rfl: 0   methocarbamol  (ROBAXIN ) 500 MG tablet, Take 1 tablet (500 mg total) by mouth every 8 (eight) hours as needed for muscle spasms., Disp: 15 tablet, Rfl: 0   Multiple Vitamin (MULTIVITAMIN) tablet,  Take 1 tablet by mouth daily with breakfast., Disp: , Rfl:    mupirocin  ointment (BACTROBAN ) 2 %, Apply 1 Application topically 2 (two) times daily., Disp: 22 g, Rfl: 0   TYLENOL  500 MG tablet, Take 500-1,000 mg by mouth every 6 (six) hours as needed for mild pain (or headaches)., Disp: , Rfl:    Vitamin D, Ergocalciferol, (DRISDOL) 1.25 MG (50000 UNIT) CAPS capsule, Take 50,000 Units by mouth once a week., Disp: , Rfl:  [2]  Social History Tobacco Use  Smoking  Status Never  Smokeless Tobacco Never  [3]  Allergies Allergen Reactions   Benadryl  [Diphenhydramine  Hcl] Rash   Latex Rash   Sulfa Antibiotics Rash   "

## 2024-06-24 ENCOUNTER — Ambulatory Visit: Admitting: Podiatry

## 2024-06-24 ENCOUNTER — Encounter: Admitting: Obstetrics and Gynecology

## 2024-07-08 ENCOUNTER — Other Ambulatory Visit (HOSPITAL_COMMUNITY)
Admission: RE | Admit: 2024-07-08 | Discharge: 2024-07-08 | Disposition: A | Source: Ambulatory Visit | Attending: Obstetrics and Gynecology | Admitting: Obstetrics and Gynecology

## 2024-07-08 ENCOUNTER — Ambulatory Visit: Admitting: Obstetrics and Gynecology

## 2024-07-08 ENCOUNTER — Encounter: Payer: Self-pay | Admitting: Obstetrics and Gynecology

## 2024-07-08 VITALS — BP 128/88 | HR 73 | Ht 63.5 in | Wt 213.4 lb

## 2024-07-08 DIAGNOSIS — Z01419 Encounter for gynecological examination (general) (routine) without abnormal findings: Secondary | ICD-10-CM

## 2024-07-08 DIAGNOSIS — N945 Secondary dysmenorrhea: Secondary | ICD-10-CM | POA: Diagnosis not present

## 2024-07-08 DIAGNOSIS — Z1231 Encounter for screening mammogram for malignant neoplasm of breast: Secondary | ICD-10-CM

## 2024-07-08 DIAGNOSIS — N898 Other specified noninflammatory disorders of vagina: Secondary | ICD-10-CM | POA: Insufficient documentation

## 2024-07-08 MED ORDER — NORETHIN ACE-ETH ESTRAD-FE 1-20 MG-MCG(24) PO TABS: 1.0000 | ORAL_TABLET | Freq: Every day | ORAL | 3 refills | Status: AC

## 2024-07-08 NOTE — Progress Notes (Signed)
 "   GYNECOLOGY ANNUAL PREVENTATIVE CARE ENCOUNTER NOTE  History:     Debra Herrera is a 44 y.o. [id number] female here for a routine annual gynecologic exam.  Current complaints: painful menses.   Denies abnormal vaginal bleeding, discharge, problems with intercourse or other gynecologic concerns.   Pt has regular menses lasting 5 days with 3-4 days being heavy.  Pt does not use tobacco.   Gynecologic History Patient's last menstrual period was 07/06/2024 (exact date). Contraception: none Last Pap: 2/25. Results were: normal with negative HPV Last mammogram: 2/25. Results were: normal  Obstetric History OB History  Gravida Para Term Preterm AB Living  2 2 2  0 0 2  SAB IAB Ectopic Multiple Live Births  0 0 0  2    # Outcome Date GA Lbr Len/2nd Weight Sex Type Anes PTL Lv  2 Term 11/09/12 [redacted]w[redacted]d 13:28 / 00:51  M Vag-Spont EPI  LIV  1 Term      Vag-Spont   LIV    Past Medical History:  Diagnosis Date   Anxiety    Depression    Headache(784.0)    Human papilloma virus    Hypercholesteremia    OCD (obsessive compulsive disorder)    TMJ arthralgia    Urinary tract infection     Past Surgical History:  Procedure Laterality Date   ADENOIDECTOMY     TONSILLECTOMY AND ADENOIDECTOMY      Medications Ordered Prior to Encounter[1]  Allergies[2]  Social History:  reports that she has never smoked. She has never used smokeless tobacco. She reports that she does not drink alcohol and does not use drugs.  Family History  Problem Relation Age of Onset   Diabetes Father    Hypertension Father    Hypertension Mother    Other Neg Hx     The following portions of the patient's history were reviewed and updated as appropriate: allergies, current medications, past family history, past medical history, past social history, past surgical history and problem list.  Review of Systems Pertinent items noted in HPI and remainder of comprehensive ROS otherwise  negative.  Physical Exam:  BP 128/88   Pulse 73   Ht 5' 3.5 (1.613 m)   Wt 213 lb 6.4 oz (96.8 kg)   LMP 07/06/2024 (Exact Date)   BMI 37.21 kg/m  CONSTITUTIONAL: Well-developed, well-nourished female in no acute distress.  HENT:  Normocephalic, atraumatic, External right and left ear normal. Oropharynx is clear and moist EYES: Conjunctivae and EOM are normal.  NECK: Normal range of motion, supple, no masses.  Normal thyroid.  SKIN: Skin is warm and dry. No rash noted. Not diaphoretic. No erythema. No pallor. MUSCULOSKELETAL: Normal range of motion. No tenderness.  No cyanosis, clubbing, or edema.  2+ distal pulses. NEUROLOGIC: Alert and oriented to person, place, and time. Normal reflexes, muscle tone coordination.  PSYCHIATRIC: Normal mood and affect. Normal behavior. Normal judgment and thought content. CARDIOVASCULAR: Normal heart rate noted, regular rhythm RESPIRATORY: Clear to auscultation bilaterally. Effort and breath sounds normal, no problems with respiration noted. BREASTS: deferred ABDOMEN: Soft, no distention noted.  No tenderness, rebound or guarding.  PELVIC: deferred, pt was on cycle   Assessment and Plan:    1. Women's annual routine gynecological examination (Primary) Normal annual exam  2. Vaginal discharge Self swab taken - Cervicovaginal ancillary only( [place])  3. Secondary dysmenorrhea Discussed mirena  IUD versus OCPs to address heavier menses and pain with cycles.  Pt desires OCPs.  Did discuss  extended regimen ofr ocps to address pain, but pt wishes to have monthly cycles. - Norethindrone Acetate-Ethinyl Estrad-FE (LOESTRIN  24 FE) 1-20 MG-MCG(24) tablet; Take 1 tablet by mouth daily.  Dispense: 84 tablet; Refill: 3  4. Screening mammogram for breast cancer ordered - MM 3D SCREENING MAMMOGRAM BILATERAL BREAST; Future   Mammogram scheduled Routine preventative health maintenance measures emphasized. Please refer to After Visit Summary for other  counseling recommendations.    F/u in 4 months to reassess painful menses  Jerilynn Buddle, MD, FACOG Obstetrician & Gynecologist, [facility] for [facility], [facility]      [1]  Current Outpatient Medications on File Prior to Visit  Medication Sig Dispense Refill   atorvastatin (LIPITOR) 80 MG tablet Take 80 mg by mouth daily.     escitalopram  (LEXAPRO ) 20 MG tablet Take 1 tablet (20 mg total) by mouth daily. 30 tablet 3   fenofibrate (TRICOR) 145 MG tablet Take 145 mg by mouth daily.     hydrOXYzine  (ATARAX ) 10 MG tablet Take 1 tablet (10 mg total) by mouth 3 (three) times daily as needed. 90 tablet 3   meloxicam  (MOBIC ) 15 MG tablet Take 1 tablet (15 mg total) by mouth daily. 30 tablet 0   Multiple Vitamin (MULTIVITAMIN) tablet Take 1 tablet by mouth daily with breakfast.     TYLENOL  500 MG tablet Take 500-1,000 mg by mouth every 6 (six) hours as needed for mild pain (or headaches).     Vitamin D, Ergocalciferol, (DRISDOL) 1.25 MG (50000 UNIT) CAPS capsule Take 50,000 Units by mouth once a week.     No current facility-administered medications on file prior to visit.  [2]  Allergies Allergen Reactions   Benadryl  [Diphenhydramine  Hcl] Rash   Latex Rash   Sulfa Antibiotics Rash   "

## 2024-07-08 NOTE — Progress Notes (Signed)
 Pt presents for bc and yeast. Pt states that she gets really bad cramps with her periods.

## 2024-07-09 LAB — CERVICOVAGINAL ANCILLARY ONLY
Bacterial Vaginitis (gardnerella): POSITIVE — AB
Candida Glabrata: NEGATIVE
Candida Vaginitis: NEGATIVE
Comment: NEGATIVE
Comment: NEGATIVE
Comment: NEGATIVE

## 2024-07-13 ENCOUNTER — Ambulatory Visit: Payer: Self-pay | Admitting: Obstetrics and Gynecology

## 2024-07-13 DIAGNOSIS — B9689 Other specified bacterial agents as the cause of diseases classified elsewhere: Secondary | ICD-10-CM

## 2024-07-13 MED ORDER — METRONIDAZOLE 500 MG PO TABS: 500.0000 mg | ORAL_TABLET | Freq: Two times a day (BID) | ORAL | 0 refills | Status: AC

## 2024-08-10 ENCOUNTER — Ambulatory Visit
